# Patient Record
Sex: Female | Born: 1955 | Race: Black or African American | Hispanic: No | Marital: Married | Smoking: Former smoker
Health system: Southern US, Community
[De-identification: ages and names within clinical notes are randomized; demographics above are authoritative.]

## PROBLEM LIST (undated history)

## (undated) ENCOUNTER — Emergency Department (HOSPITAL_COMMUNITY): Disposition: A | Discharge status: Home / Self Care | Payer: BC Managed Care – PPO

## (undated) DIAGNOSIS — I10 Essential (primary) hypertension: Secondary | ICD-10-CM

## (undated) DIAGNOSIS — J439 Emphysema, unspecified: Secondary | ICD-10-CM

## (undated) HISTORY — PX: HAND SURGERY: SHX662

## (undated) HISTORY — PX: NOVASURE ABLATION: SHX5394

---

## 1997-11-30 ENCOUNTER — Ambulatory Visit (HOSPITAL_COMMUNITY): Admission: RE | Admit: 1997-11-30 | Discharge: 1997-11-30 | Payer: Self-pay | Admitting: Pulmonary Disease

## 1998-02-15 ENCOUNTER — Emergency Department (HOSPITAL_COMMUNITY): Admission: EM | Admit: 1998-02-15 | Discharge: 1998-02-15 | Payer: Self-pay | Admitting: Emergency Medicine

## 1998-06-10 ENCOUNTER — Other Ambulatory Visit: Admission: RE | Admit: 1998-06-10 | Discharge: 1998-06-10 | Payer: Self-pay | Admitting: Obstetrics and Gynecology

## 1998-08-19 ENCOUNTER — Ambulatory Visit: Admission: RE | Admit: 1998-08-19 | Discharge: 1998-08-19 | Payer: Self-pay | Admitting: Pulmonary Disease

## 1998-12-23 ENCOUNTER — Emergency Department (HOSPITAL_COMMUNITY): Admission: EM | Admit: 1998-12-23 | Discharge: 1998-12-23 | Payer: Self-pay | Admitting: Emergency Medicine

## 1999-09-02 ENCOUNTER — Other Ambulatory Visit: Admission: RE | Admit: 1999-09-02 | Discharge: 1999-09-02 | Payer: Self-pay | Admitting: Obstetrics and Gynecology

## 1999-10-27 ENCOUNTER — Ambulatory Visit: Admission: RE | Admit: 1999-10-27 | Discharge: 1999-10-27 | Payer: Self-pay | Admitting: Pulmonary Disease

## 2000-10-14 ENCOUNTER — Ambulatory Visit: Admission: RE | Admit: 2000-10-14 | Discharge: 2000-10-14 | Payer: Self-pay | Admitting: Pulmonary Disease

## 2001-07-12 ENCOUNTER — Emergency Department (HOSPITAL_COMMUNITY): Admission: EM | Admit: 2001-07-12 | Discharge: 2001-07-12 | Payer: Self-pay | Admitting: Emergency Medicine

## 2002-12-19 ENCOUNTER — Other Ambulatory Visit: Admission: RE | Admit: 2002-12-19 | Discharge: 2002-12-19 | Payer: Self-pay | Admitting: Obstetrics and Gynecology

## 2003-01-26 ENCOUNTER — Encounter: Admission: RE | Admit: 2003-01-26 | Discharge: 2003-01-26 | Payer: Self-pay | Admitting: Orthopedic Surgery

## 2003-01-26 ENCOUNTER — Encounter: Payer: Self-pay | Admitting: Orthopedic Surgery

## 2003-01-30 ENCOUNTER — Ambulatory Visit (HOSPITAL_BASED_OUTPATIENT_CLINIC_OR_DEPARTMENT_OTHER): Admission: RE | Admit: 2003-01-30 | Discharge: 2003-01-30 | Payer: Self-pay | Admitting: Orthopedic Surgery

## 2004-04-25 ENCOUNTER — Other Ambulatory Visit: Admission: RE | Admit: 2004-04-25 | Discharge: 2004-04-25 | Payer: Self-pay | Admitting: Obstetrics and Gynecology

## 2005-06-02 ENCOUNTER — Other Ambulatory Visit: Admission: RE | Admit: 2005-06-02 | Discharge: 2005-06-02 | Payer: Self-pay | Admitting: Obstetrics and Gynecology

## 2005-06-17 ENCOUNTER — Encounter: Admission: RE | Admit: 2005-06-17 | Discharge: 2005-06-17 | Payer: Self-pay | Admitting: Obstetrics and Gynecology

## 2007-01-13 ENCOUNTER — Encounter: Admission: RE | Admit: 2007-01-13 | Discharge: 2007-01-13 | Payer: Self-pay | Admitting: Obstetrics and Gynecology

## 2010-05-22 ENCOUNTER — Ambulatory Visit (HOSPITAL_COMMUNITY): Admission: RE | Admit: 2010-05-22 | Discharge: 2010-05-22 | Payer: Self-pay | Admitting: Obstetrics and Gynecology

## 2010-09-23 LAB — CBC
HCT: 39 % (ref 36.0–46.0)
Hemoglobin: 13.2 g/dL (ref 12.0–15.0)
MCH: 32.3 pg (ref 26.0–34.0)
MCHC: 33.8 g/dL (ref 30.0–36.0)
MCV: 95.7 fL (ref 78.0–100.0)
Platelets: 209 10*3/uL (ref 150–400)
RBC: 4.08 MIL/uL (ref 3.87–5.11)
RDW: 12.6 % (ref 11.5–15.5)
WBC: 8.9 10*3/uL (ref 4.0–10.5)

## 2010-09-23 LAB — PREGNANCY, URINE: Preg Test, Ur: NEGATIVE

## 2010-11-28 NOTE — Op Note (Signed)
NAME:  Teresa Woodward, Teresa Woodward                          ACCOUNT NO.:  [id number]   MEDICAL RECORD NO.:  [id number]                   PATIENT TYPE:  AMB   LOCATION:  DSC                                  FACILITY:  [facility]   PHYSICIAN:  Katy Fitch. Naaman Plummer., M.D.          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  Identification of ganglion cyst tracking along index metacarpal artery.   OPERATION PERFORMED:  Excision of ganglion from left index carpometacarpal  joint tracking along index metacarpal artery.   SURGEON:  Katy Fitch. Sypher, M.D.   ASSISTANT:  Jonni Sanger, P.A.   ANESTHESIA:  Infraclavicular block.   SUPERVISING ANESTHESIOLOGIST:  Dr. Noreene Larsson.   INDICATIONS FOR PROCEDURE:  Teresa Woodward is a 55 year old woman referred by  Dr. Pete Glatter for evaluation of an enlarging mass on the dorsal aspect of  her right hand.  Clinical examination revealed a painless mass that she  noted was enlarging.  This was consistent with a probable periarterial  ganglion tracking along the path of the index metacarpal artery.  X-rays of  her hand were nondiagnostic.  We recommended excisional biopsy for diagnosis  and hopeful resolution of this predicament.  Preoperatively, Teresa Woodward was  advised that ganglia are poorly understood and can recur, particularly when  noted to be tracking along the wall of an artery.  After informed consent  she is brought to the operating room at this time.   DESCRIPTION OF PROCEDURE:  Teresa Woodward was brought to the operating room  and placed in supine position on the operating table.   Following an  infraclavicular block in the holding area by Dr. Noreene Larsson, anesthesia was  satisfactory in the right arm.  The arm was prepped with Betadine soap and  solution and sterilely draped.   The arm was exsanguinated with an Esmarch bandage and an arterial tourniquet  on the proximal brachium inflated to 220 mmHg.  The procedure commenced with  a transverse incision directly over the mass.  Subcutaneous tissues were  carefully divided revealing a mass presenting between the extensor carpi  radialis longus and brevis tendons.  This was circumferentially dissected  and found to be a significant ganglion measuring about 2 cm in length and  about 1.5 cm in width tracking along the index metacarpal artery.  She had a  large caliber artery measuring about 2 mm in diameter.  The mass was  circumferentially dissected off the artery and followed back to the [facility]  joint.  There it was amputated with a rongeur.  The exit point from the [facility]  joint  was electrocauterized thoroughly with bipolar forceps taking care to  protect the index metacarpal artery.  The wound was irrigated.  Tourniquet  was released.  No hemostasis issues were noted.  The wound was then repaired  with intradermal 3-0 Prolene and then Steri-Strips.  Compressive dressing  was applied with a volar plaster splint maintaining the wrist in 5 degrees  dorsiflexion.   For aftercare, Teresa Woodward is given a prescription for Percocet 5 mg one to  two tablets by mouth every four to six hours as needed for pain.  Total of  20 tablets without refill.  She will return to our office in follow-up in  seven to 10 days for dressing change and advancement to an exercise program.                                                Katy Fitch. Naaman Plummer., M.D.    RVS/MEDQ  D:  01/30/2003  T:  01/30/2003  Job:  [id number]   cc:   Hal T. Stoneking, M.D.  [address] [place], [place] [address]  Fax: [phone]

## 2011-07-21 ENCOUNTER — Emergency Department (HOSPITAL_COMMUNITY): Payer: BC Managed Care – PPO

## 2011-07-21 ENCOUNTER — Encounter: Payer: Self-pay | Admitting: Emergency Medicine

## 2011-07-21 ENCOUNTER — Observation Stay (HOSPITAL_COMMUNITY)
Admission: EM | Admit: 2011-07-21 | Discharge: 2011-07-22 | Disposition: A | Discharge status: Home / Self Care | Payer: BC Managed Care – PPO | Attending: Emergency Medicine | Admitting: Emergency Medicine

## 2011-07-21 DIAGNOSIS — R0602 Shortness of breath: Secondary | ICD-10-CM | POA: Insufficient documentation

## 2011-07-21 DIAGNOSIS — R079 Chest pain, unspecified: Principal | ICD-10-CM | POA: Insufficient documentation

## 2011-07-21 DIAGNOSIS — J439 Emphysema, unspecified: Secondary | ICD-10-CM | POA: Insufficient documentation

## 2011-07-21 HISTORY — DX: Emphysema, unspecified: J43.9

## 2011-07-21 LAB — COMPREHENSIVE METABOLIC PANEL
ALT: 11 U/L (ref 0–35)
AST: 18 U/L (ref 0–37)
Albumin: 3.7 g/dL (ref 3.5–5.2)
Alkaline Phosphatase: 78 U/L (ref 39–117)
BUN: 8 mg/dL (ref 6–23)
CO2: 23 mEq/L (ref 19–32)
Calcium: 9.4 mg/dL (ref 8.4–10.5)
Chloride: 103 mEq/L (ref 96–112)
Creatinine, Ser: 0.64 mg/dL (ref 0.50–1.10)
GFR calc Af Amer: >90 mL/min (ref >90)
GFR calc non Af Amer: >90 mL/min (ref >90)
Glucose, Bld: 106 mg/dL — ABNORMAL HIGH (ref 70–99)
Potassium: 3.5 mEq/L (ref 3.5–5.1)
Sodium: 138 mEq/L (ref 135–145)
Total Bilirubin: 0.3 mg/dL (ref 0.3–1.2)
Total Protein: 7.8 g/dL (ref 6.0–8.3)

## 2011-07-21 LAB — CARDIAC PANEL(CRET KIN+CKTOT+MB+TROPI)
CK, MB: 1.6 ng/mL (ref 0.3–4.0)
Relative Index: INVALID (ref 0.0–2.5)
Total CK: 48 U/L (ref 7–177)
Troponin I: <0.3 ng/mL (ref <0.30)

## 2011-07-21 LAB — CBC
HCT: 37.4 % (ref 36.0–46.0)
Hemoglobin: 13.4 g/dL (ref 12.0–15.0)
MCH: 32.2 pg (ref 26.0–34.0)
MCHC: 35.8 g/dL (ref 30.0–36.0)
MCV: 89.9 fL (ref 78.0–100.0)
Platelets: 224 10*3/uL (ref 150–400)
RBC: 4.16 MIL/uL (ref 3.87–5.11)
RDW: 12.1 % (ref 11.5–15.5)
WBC: 6.9 10*3/uL (ref 4.0–10.5)

## 2011-07-21 LAB — POCT I-STAT TROPONIN I
Troponin i, poc: 0.01 ng/mL (ref 0.00–0.08)
Troponin i, poc: 0.01 ng/mL (ref 0.00–0.08)

## 2011-07-21 LAB — LIPASE, BLOOD: Lipase: 14 U/L (ref 11–59)

## 2011-07-21 MED ORDER — IOHEXOL 300 MG/ML  SOLN
80.0000 mL | Freq: Once | INTRAMUSCULAR | Status: AC | PRN
  Administered 2011-07-21: 80 mL via INTRAVENOUS

## 2011-07-21 NOTE — ED Notes (Signed)
Move to CDU--chest pain protocol-done

## 2011-07-21 NOTE — ED Notes (Signed)
Received partial report from Vela Prose, RN.  Advised pt may come to CDU 7 after triage/hx info obtained and entered.

## 2011-07-21 NOTE — ED Notes (Signed)
Pt's bp went up when I was getting her bp, because she was going to CT and she was nervious.1:00pm JG

## 2011-07-21 NOTE — ED Provider Notes (Addendum)
History     CSN: [id number]  Arrival date & time 07/21/11  1141   First MD Initiated Contact with Patient 07/21/11 1147      No chief complaint on file. CC: chest pain  (Consider location/radiation/quality/duration/timing/severity/associated sxs/prior treatment) The history is provided by the patient.  HPI: Pt p/w chest pain starting this morning at 0800. Pain is substernal and worse with inspiration. Does not radiate. No nausea vomiting. Has never had cardiac workup. Mild SOB with no wheezing, cough. No recent travel. Pt does admit to quite a bit of stress recently.       History  Substance Use Topics  . Smoking status: Not on file  . Smokeless tobacco: Not on file  . Alcohol Use: Not on file    OB History    No data available      Review of Systems  Constitutional: Negative for fever and chills.  HENT: Negative.   Eyes: Negative.   Respiratory: Positive for chest tightness and shortness of breath. Negative for apnea, cough and wheezing.   Cardiovascular: Positive for chest pain. Negative for palpitations and leg swelling.  Gastrointestinal: Negative.   Genitourinary: Negative.   Musculoskeletal: Negative.   Skin: Negative.   Neurological: Negative.   Psychiatric/Behavioral:       +stress/anxiety    Allergies  Sulfa antibiotics  Home Medications   Current Outpatient Rx  Name Route Sig Dispense Refill  . ZOLPIDEM TARTRATE 10 MG PO TABS Oral Take 5 mg by mouth at bedtime as needed. For sleep       BP 134/91  Pulse 68  Temp(Src) 98.2 F (36.8 C) (Oral)  Resp 13  SpO2 100%  Physical Exam  Nursing note and vitals reviewed. Constitutional: She is oriented to person, place, and time. She appears well-developed and well-nourished. No distress.  HENT:  Head: Normocephalic and atraumatic.  Mouth/Throat: Oropharynx is clear and moist.  Eyes: EOM are normal. Pupils are equal, round, and reactive to light.  Neck: Normal range of motion. Neck supple.    Cardiovascular: Normal rate, regular rhythm, normal heart sounds and intact distal pulses.  Exam reveals no gallop and no friction rub.   No murmur heard. Pulmonary/Chest: Effort normal and breath sounds normal. No respiratory distress. She has no wheezes. She has no rales. She exhibits no tenderness.  Abdominal: Soft. Bowel sounds are normal.  Musculoskeletal: Normal range of motion. She exhibits no edema and no tenderness.  Neurological: She is alert and oriented to person, place, and time.  Skin: Skin is warm and dry. No rash noted. No erythema.  Psychiatric: She has a normal mood and affect. Her behavior is normal.    ED Course  Procedures (including critical care time)  Labs Reviewed  COMPREHENSIVE METABOLIC PANEL - Abnormal; Notable for the following:    Glucose, Bld 106 (*)    All other components within normal limits  CARDIAC PANEL(CRET KIN+CKTOT+MB+TROPI)  CBC  LIPASE, BLOOD   Dg Chest 2 View  07/21/2011  *RADIOLOGY REPORT*  Clinical Data: Chest pain, shortness of breath  CHEST - 2 VIEW  Comparison: None.  Findings: Mild right apical opacity, at least some of which may be osseous.  Lungs otherwise clear. No pleural effusion or pneumothorax.  Cardiomediastinal silhouette is within normal limits.  Mild degenerative changes of the visualized thoracolumbar spine.  IMPRESSION: Mild right apical opacity.  CT chest is suggested for further evaluation to exclude underlying mass.  Original Report Authenticated By: Charline Bills, M.D.  No diagnosis found.   Date: 07/21/2011  Rate:77  Rhythm: normal sinus rhythm  QRS Axis: normal  Intervals: normal  ST/T Wave abnormalities: normal  Conduction Disutrbances:none  Narrative Interpretation:   Old EKG Reviewed: none available   MDM  Pt received aspin and NTG en route and chest pain is now completely resolved         Loren Racer, MD 07/24/11 2202

## 2011-07-21 NOTE — ED Notes (Signed)
Heart healthy diet ordered. 

## 2011-07-21 NOTE — ED Notes (Signed)
Pt returned from CT angio

## 2011-07-21 NOTE — ED Notes (Signed)
Per EMS pt took her mother to the MD and pt was having chest pain. EMS was called. + SOB. Denied n/v/diaphoresis

## 2011-07-22 DIAGNOSIS — R072 Precordial pain: Secondary | ICD-10-CM

## 2011-07-22 LAB — POCT I-STAT TROPONIN I: Troponin i, poc: 0 ng/mL (ref 0.00–0.08)

## 2011-07-22 MED ORDER — ONDANSETRON HCL 4 MG/2ML IJ SOLN
INTRAMUSCULAR | Status: AC
  Filled 2011-07-22: qty 2

## 2011-07-22 MED ORDER — ONDANSETRON HCL 4 MG/2ML IJ SOLN
4.0000 mg | Freq: Once | INTRAMUSCULAR | Status: AC
  Administered 2011-07-22: 4 mg via INTRAVENOUS
  Filled 2011-07-22: qty 2

## 2011-07-22 NOTE — ED Notes (Signed)
Patient refused zofran. States she did not want to be stuck or given anymore meds at this time. Will reevaluate and continue to monitor patient

## 2011-07-22 NOTE — ED Notes (Signed)
Report received, assumed care.  

## 2011-07-22 NOTE — Progress Notes (Signed)
*  PRELIMINARY RESULTS* Echocardiogram Echocardiogram Stress Test has been performed.  Clide Deutscher RDCS 07/22/2011, 11:53 AM

## 2011-07-22 NOTE — ED Notes (Addendum)
Informed patient and/or family of status. No voiced complaints presently. NAD. Denies any nausea presently.

## 2011-07-22 NOTE — ED Notes (Signed)
Returned from stress echo. Tolerated well. Denies any CP, SOB.

## 2011-07-22 NOTE — ED Notes (Signed)
Transported for stress echo via w/c

## 2011-07-22 NOTE — ED Notes (Signed)
Patient in room vomiting in trash can. EDP notified.

## 2011-07-22 NOTE — ED Provider Notes (Signed)
Patient placed in CDU under chest pain protocol.  Patient with recent onset of right sided substernal chest pain, pleuritic in nature.  CTA performed earlier negative for PE.  Patient has strong family cardiac history.  Patient scheduled for stress echo in AM.  Patient resting comfortably with family at bedside.  Lungs CTA bilaterally.  S1/S2, RRR, no murmur. Abdomen soft, bowel sounds present.  Strong distal pulses palpated all extremities.  Cardiac markers negative. 12 lead reviewed, no indication of ischemia.  Treatment and diagnostic plan discussed with patient and family.  12:04 AM  Patient signed out to Dr. Patria Mane.  Jimmye Norman, NP 07/22/11 0004

## 2011-07-22 NOTE — Progress Notes (Signed)
Observation review is complete. 

## 2011-07-22 NOTE — ED Provider Notes (Signed)
History     CSN: [id number]  Arrival date & time 07/21/11  1141   First MD Initiated Contact with Patient 07/21/11 1147      Chief Complaint  Patient presents with  . Chest Pain    (Consider location/radiation/quality/duration/timing/severity/associated sxs/prior treatment) HPI  Past Medical History  Diagnosis Date  . Bullous emphysema     Past Surgical History  Procedure Date  . Hand surgery   . Novasure ablation     No family history on file.  History  Substance Use Topics  . Smoking status: Former Smoker -- 0.2 packs/day for 5 years  . Smokeless tobacco: Not on file  . Alcohol Use: 3.5 oz/week    7 drink(s) per week    OB History    Grav Para Term Preterm Abortions TAB SAB Ect Mult Living                  Review of Systems  Allergies  Sulfa antibiotics  Home Medications   Current Outpatient Rx  Name Route Sig Dispense Refill  . ZOLPIDEM TARTRATE 10 MG PO TABS Oral Take 5 mg by mouth at bedtime as needed. For sleep       BP 128/80  Pulse 68  Temp(Src) 98.2 F (36.8 C) (Oral)  Resp 19  SpO2 93%  Physical Exam  ED Course  Procedures (including critical care time)  Labs Reviewed  COMPREHENSIVE METABOLIC PANEL - Abnormal; Notable for the following:    Glucose, Bld 106 (*)    All other components within normal limits  CARDIAC PANEL(CRET KIN+CKTOT+MB+TROPI)  CBC  LIPASE, BLOOD  POCT I-STAT TROPONIN I  POCT I-STAT TROPONIN I  POCT I-STAT TROPONIN I  I-STAT TROPONIN I  I-STAT TROPONIN I  I-STAT TROPONIN I   Dg Chest 2 View  07/21/2011  *RADIOLOGY REPORT*  Clinical Data: Chest pain, shortness of breath  CHEST - 2 VIEW  Comparison: None.  Findings: Mild right apical opacity, at least some of which may be osseous.  Lungs otherwise clear. No pleural effusion or pneumothorax.  Cardiomediastinal silhouette is within normal limits.  Mild degenerative changes of the visualized thoracolumbar spine.  IMPRESSION: Mild right apical opacity.  CT chest is  suggested for further evaluation to exclude underlying mass.  Original Report Authenticated By: Charline Bills, M.D.   Ct Angio Chest W/cm &/or Wo Cm  07/21/2011  *RADIOLOGY REPORT*  Clinical Data: Mass lesion.  Chest pain.  CT ANGIOGRAPHY CHEST  Technique:  Multidetector CT imaging of the chest using the standard protocol during bolus administration of intravenous contrast. Multiplanar reconstructed images including MIPs were obtained and reviewed to evaluate the vascular anatomy.  Comparison: Two-view chest 07/21/2011.  Findings: Pulmonary arterial opacification is satisfactory.  No focal filling defects are evident to suggest pulmonary emboli.  The heart size is normal.  No significant adenopathy is present. There is no significant pleural or pericardial effusion.  Limited imaging of the upper abdomen is unremarkable.  A moderate emphysematous changes are noted at the lung apices bilaterally.  There is a pleural-based soft tissue mass posteriorly in the right upper lobe associated the pleura.  The soft tissue measures 2.9 x 0.9 x 2.1 cm.  There is slight pleural thickening or scarring at the left lung apex.  The bone windows are unremarkable.  IMPRESSION:  1.  2.9 cm soft tissue nodule versus scarring at the right lung apex.  This is pleural-based.  PET scan would be useful for further evaluation as clinically indicated.  Alternately, short term 3- month follow-up chest CT scan without contrast could be utilized. 2.  No evidence for pulmonary embolus. 3.  Emphysema.  Original Report Authenticated By: Jamesetta Orleans. MATTERN, M.D.   1:00 PM Lab workup and stress test were negative. Case discussed with Dr. Pete Glatter, her internist.  Where her workup is negative, it is safe to go home.  She should call his office to make a followup appointment with him.   1. Chest pain             Carleene Cooper III, MD 07/22/11 1304

## 2011-07-22 NOTE — ED Provider Notes (Signed)
11:55 AM I received a call from cardiology stating patient did have hypertensive response to stress test but otherwise had a normal stress echo.    I have discussed result with Dr Ignacia Palma who is taking care of patient.    Dillard Cannon [place], [place] 07/22/11 1218

## 2011-07-24 NOTE — ED Provider Notes (Signed)
Medical screening examination/treatment/procedure(s) were performed by non-physician practitioner and as supervising physician I was immediately available for consultation/collaboration.   Bridgit Eynon, MD 07/24/11 0156 

## 2011-07-25 NOTE — ED Provider Notes (Signed)
Medical screening examination/treatment/procedure(s) were performed by non-physician practitioner and as supervising physician I was immediately available for consultation/collaboration.  Loren Racer, MD 07/25/11 [phone]

## 2011-07-27 ENCOUNTER — Other Ambulatory Visit: Payer: Self-pay | Admitting: Obstetrics and Gynecology

## 2011-10-29 ENCOUNTER — Other Ambulatory Visit: Payer: Self-pay | Admitting: Geriatric Medicine

## 2011-10-29 DIAGNOSIS — D386 Neoplasm of uncertain behavior of respiratory organ, unspecified: Secondary | ICD-10-CM

## 2011-11-02 ENCOUNTER — Ambulatory Visit
Admission: RE | Admit: 2011-11-02 | Discharge: 2011-11-02 | Disposition: A | Discharge status: Home / Self Care | Payer: BC Managed Care – PPO | Source: Ambulatory Visit | Attending: Geriatric Medicine | Admitting: Geriatric Medicine

## 2011-11-02 DIAGNOSIS — D386 Neoplasm of uncertain behavior of respiratory organ, unspecified: Secondary | ICD-10-CM

## 2012-08-23 ENCOUNTER — Other Ambulatory Visit: Payer: Self-pay | Admitting: Obstetrics and Gynecology

## 2012-08-23 DIAGNOSIS — R928 Other abnormal and inconclusive findings on diagnostic imaging of breast: Secondary | ICD-10-CM

## 2012-08-24 ENCOUNTER — Telehealth: Payer: Self-pay | Admitting: Genetic Counselor

## 2012-08-24 NOTE — Telephone Encounter (Signed)
S/W PT IN REF TO NP APPT WITH [facility] REFERRING DR Dareen Piano

## 2012-09-14 ENCOUNTER — Ambulatory Visit
Admission: RE | Admit: 2012-09-14 | Discharge: 2012-09-14 | Disposition: A | Discharge status: Home / Self Care | Payer: No Typology Code available for payment source | Source: Ambulatory Visit | Attending: Obstetrics and Gynecology | Admitting: Obstetrics and Gynecology

## 2012-09-14 ENCOUNTER — Other Ambulatory Visit: Payer: Self-pay | Admitting: Obstetrics and Gynecology

## 2012-09-14 DIAGNOSIS — R928 Other abnormal and inconclusive findings on diagnostic imaging of breast: Secondary | ICD-10-CM

## 2012-10-31 ENCOUNTER — Other Ambulatory Visit: Payer: BC Managed Care – PPO

## 2012-10-31 ENCOUNTER — Encounter: Payer: Self-pay | Admitting: Genetic Counselor

## 2012-10-31 ENCOUNTER — Ambulatory Visit (HOSPITAL_BASED_OUTPATIENT_CLINIC_OR_DEPARTMENT_OTHER): Payer: No Typology Code available for payment source | Admitting: Genetic Counselor

## 2012-10-31 DIAGNOSIS — Z800 Family history of malignant neoplasm of digestive organs: Secondary | ICD-10-CM

## 2012-10-31 DIAGNOSIS — Z803 Family history of malignant neoplasm of breast: Secondary | ICD-10-CM

## 2012-10-31 DIAGNOSIS — Z8249 Family history of ischemic heart disease and other diseases of the circulatory system: Secondary | ICD-10-CM

## 2012-10-31 DIAGNOSIS — IMO0002 Reserved for concepts with insufficient information to code with codable children: Secondary | ICD-10-CM

## 2012-10-31 NOTE — Progress Notes (Signed)
Dr.  Malva Limes requested a consultation for genetic counseling and risk assessment for Teresa Woodward, a 57 y.o. female, for discussion of her family history of breast, prostate, colon and brain cancer. She presents to clinic today to discuss the possibility of a genetic predisposition to cancer, and to further clarify her risks, as well as her family members' risks for cancer.   HISTORY OF PRESENT ILLNESS: Teresa Woodward is a 56 y.o. female with no personal history of cancer.    Past Medical History  Diagnosis Date  . Bullous emphysema     Past Surgical History  Procedure Laterality Date  . Hand surgery    . Novasure ablation      History  Substance Use Topics  . Smoking status: Former Smoker -- 0.25 packs/day for 5 years  . Smokeless tobacco: Former Neurosurgeon    Quit date: 11/01/1995  . Alcohol Use: 3.5 oz/week    7 drink(s) per week    REPRODUCTIVE HISTORY AND PERSONAL RISK ASSESSMENT FACTORS: Menarche was at age 22.   Menopause at age 40 Uterus Intact: Yes Ovaries Intact: Yes G3P3A0 , first live birth at age 69  She has not previously undergone treatment for infertility.   OCP use for 1 year   She has not used HRT in the past.    FAMILY HISTORY:  We obtained a detailed, 4-generation family history.  Significant diagnoses are listed below: Family History  Problem Relation Age of Onset  . Breast cancer Mother 63  . Colon cancer Father 55    smoker  . Breast cancer Sister     dx in her 26s; paternal half sister  . Prostate cancer Brother     dx in his 67s; full sibling  . Brain cancer Maternal Aunt     dx in her 82s  . Breast cancer Sister     dx in her 54s; paternal half sister  . Prostate cancer Brother     dx in his 5s; full sibling  . Brain cancer Maternal Aunt     dx in her 79s  . Prostate cancer Cousin     maternal cousin  The patient has three full brothers and 10 paternal half siblings.  Two full brothers developed prostate cancer in their 34s.   Two half sisters developed breast cancer in their 69s.  The patient's father was a smoker and developed colon cancer at 6, and her mother was diagnosed with breast cancer at age 32.  Her mother had 71 brothers and sisters.  Two sisters had brain tumors, and a maternal cousin had prostate cancer.  The patient's father had one sister who did not have cancer.  His father died in his 78s-30s from a farm accident and his mother died in her 20s-60s resulting from an accident.  No other cancer was reported in the family.  Patient's maternal ancestors are of [place] and [place] descent, and paternal ancestors are of [place] and [place] descent. There is no reported Ashkenazi Jewish ancestry. There is no known consanguinity.  GENETIC COUNSELING RISK ASSESSMENT, DISCUSSION, AND SUGGESTED FOLLOW UP: We reviewed the natural history and genetic etiology of sporadic, familial and hereditary cancer syndromes.  About 5-10% of breast cancer is hereditary.  Of this, about 85% is the result of a BRCA1 or BRCA2 mutation.  We reviewed the red flags of hereditary cancer syndromes and the dominant inheritance patterns. At this time, she does not meet the medical criteria for genetic testing  based on her family history as she would need a total of 3 family members with breast cancer from one side of her family.  She has two sisters from her father's side, and only her mother on the maternal side.  The patient's family history is suggestive of the following possible diagnosis: sporadic or familial breast cancer  In order to estimate her chance of having a BRCA mutation, we used statistical models (Tyrer Cusik and [employer]) and laboratory data that take into account her personal medical history, family history and ancestry.  Because each model is different, there can be a lot of variability in the risks they give.  Therefore, these numbers must be considered a rough range and not a precise risk of having a BRCA  mutation.  These models estimate that she has approximately a 0.15-3% chance of having a mutation. Based on this assessment of her family and personal history, genetic testing is not recommended.  Based on the patient's personal and family history, statistical models (Tyrer cusik)  and literature data were used to estimate her risk of developing breast cancer. These estimate her lifetime risk of developing breast cancer to be approximately 12.2-17.6%. This estimation does not take into account any genetic testing results.   After considering the risks, benefits, and limitations, the patient declined testing.   The patient was seen for a total of 60 minutes, greater than 50% of which was spent face-to-face counseling.  This plan is being carried out per Dr. Loraine Leriche Anderson's recommendations.  This note will also be sent to the referring provider via the electronic medical record. The patient will be supplied with a summary of this genetic counseling discussion as well as educational information on the discussed hereditary cancer syndromes following the conclusion of their visit.   Patient was discussed with Dr. Drue Second.   EPIC CC: Malva Limes, MD Merlene Laughter, MD   _______________________________________________________________________ For Office Staff:  Number of people involved in session: 3 Was an Intern/ student involved with case: yes }

## 2012-11-01 ENCOUNTER — Other Ambulatory Visit: Payer: Self-pay | Admitting: Geriatric Medicine

## 2012-11-01 DIAGNOSIS — R9389 Abnormal findings on diagnostic imaging of other specified body structures: Secondary | ICD-10-CM

## 2012-11-04 ENCOUNTER — Ambulatory Visit
Admission: RE | Admit: 2012-11-04 | Discharge: 2012-11-04 | Disposition: A | Discharge status: Home / Self Care | Payer: No Typology Code available for payment source | Source: Ambulatory Visit | Attending: Geriatric Medicine | Admitting: Geriatric Medicine

## 2012-11-04 DIAGNOSIS — R9389 Abnormal findings on diagnostic imaging of other specified body structures: Secondary | ICD-10-CM

## 2012-11-04 MED ORDER — IOHEXOL 240 MG/ML SOLN: 75.0000 mL | Freq: Once | INTRAMUSCULAR | Status: AC | PRN

## 2013-08-22 ENCOUNTER — Other Ambulatory Visit: Payer: Self-pay | Admitting: Obstetrics and Gynecology

## 2013-09-08 ENCOUNTER — Emergency Department (HOSPITAL_COMMUNITY)
Admission: EM | Admit: 2013-09-08 | Discharge: 2013-09-09 | Disposition: A | Discharge status: Home / Self Care | Payer: BC Managed Care – PPO | Attending: Emergency Medicine | Admitting: Emergency Medicine

## 2013-09-08 ENCOUNTER — Emergency Department (HOSPITAL_COMMUNITY): Payer: BC Managed Care – PPO

## 2013-09-08 ENCOUNTER — Encounter (HOSPITAL_COMMUNITY): Payer: Self-pay | Admitting: Emergency Medicine

## 2013-09-08 DIAGNOSIS — Y92009 Unspecified place in unspecified non-institutional (private) residence as the place of occurrence of the external cause: Secondary | ICD-10-CM | POA: Insufficient documentation

## 2013-09-08 DIAGNOSIS — Z8709 Personal history of other diseases of the respiratory system: Secondary | ICD-10-CM | POA: Insufficient documentation

## 2013-09-08 DIAGNOSIS — I10 Essential (primary) hypertension: Secondary | ICD-10-CM | POA: Insufficient documentation

## 2013-09-08 DIAGNOSIS — Y939 Activity, unspecified: Secondary | ICD-10-CM | POA: Insufficient documentation

## 2013-09-08 DIAGNOSIS — W1809XA Striking against other object with subsequent fall, initial encounter: Secondary | ICD-10-CM | POA: Insufficient documentation

## 2013-09-08 DIAGNOSIS — S20219A Contusion of unspecified front wall of thorax, initial encounter: Secondary | ICD-10-CM | POA: Insufficient documentation

## 2013-09-08 DIAGNOSIS — Z79899 Other long term (current) drug therapy: Secondary | ICD-10-CM | POA: Insufficient documentation

## 2013-09-08 DIAGNOSIS — S20212A Contusion of left front wall of thorax, initial encounter: Secondary | ICD-10-CM

## 2013-09-08 DIAGNOSIS — W19XXXA Unspecified fall, initial encounter: Secondary | ICD-10-CM

## 2013-09-08 DIAGNOSIS — Z87891 Personal history of nicotine dependence: Secondary | ICD-10-CM | POA: Insufficient documentation

## 2013-09-08 HISTORY — DX: Essential (primary) hypertension: I10

## 2013-09-08 NOTE — ED Notes (Signed)
Pt ambulatory to exam room with steady gait.  

## 2013-09-08 NOTE — ED Notes (Addendum)
Pt reports that at 2045 tonight she fell while going down a ramp due to slipping on ice. Pt reports hitting her head, however denies LOC, pt denies anticoagulant therapy. Pt is A/O x4, in NAD, and vitals are WDL.

## 2013-09-09 NOTE — ED Provider Notes (Signed)
Medical screening examination/treatment/procedure(s) were performed by non-physician practitioner and as supervising physician I was immediately available for consultation/collaboration.   EKG Interpretation None        Julianne Rice, MD 09/09/13 [phone]

## 2013-09-09 NOTE — ED Provider Notes (Signed)
CSN: [id number]     Arrival date & time 09/08/13  2125 History   First MD Initiated Contact with Patient 09/08/13 2309     Chief Complaint  Patient presents with  . Fall   HPI  History provided by the patient. Patient is a 58 year old female with history of hypertension who presents with pain soreness after a fall. Patient reports slipping on her ramp at home outside causing her to land on her bottom and left side. She also reports bumping her head slightly. There was no LOC. Since that time she has pain in her left lateral rib area. She denies any shortness of breath. No headache, weakness or numbness. She denies any medications for treatment. No other aggravating or alleviating factors. No other associated symptoms.    Past Medical History  Diagnosis Date  . Bullous emphysema   . Hypertension    Past Surgical History  Procedure Laterality Date  . Hand surgery    . Novasure ablation     Family History  Problem Relation Age of Onset  . Breast cancer Mother 72  . Colon cancer Father 61    smoker  . Breast cancer Sister     dx in her 15s; paternal half sister  . Prostate cancer Brother     dx in his 66s; full sibling  . Brain cancer Maternal Aunt     dx in her 66s  . Breast cancer Sister     dx in her 17s; paternal half sister  . Prostate cancer Brother     dx in his 75s; full sibling  . Brain cancer Maternal Aunt     dx in her 51s  . Prostate cancer Cousin     maternal cousin   History  Substance Use Topics  . Smoking status: Former Smoker -- 0.25 packs/day for 5 years  . Smokeless tobacco: Former Systems developer    Quit date: 11/01/1995  . Alcohol Use: 3.5 oz/week    7 drink(s) per week   OB History   Grav Para Term Preterm Abortions TAB SAB Ect Mult Living                 Review of Systems  Eyes: Negative for visual disturbance.  Neurological: Negative for syncope, weakness, numbness and headaches.  All other systems reviewed and are negative.      Allergies    Sulfa antibiotics  Home Medications   Current Outpatient Rx  Name  Route  Sig  Dispense  Refill  . acetaminophen-codeine (TYLENOL #3) 300-30 MG per tablet   Oral   Take 1-2 tablets by mouth every 6 (six) hours as needed for moderate pain.         . hydrochlorothiazide (MICROZIDE) 12.5 MG capsule   Oral   Take 12.5 mg by mouth daily.         Marland Kitchen zolpidem (AMBIEN) 10 MG tablet   Oral   Take 5 mg by mouth at bedtime as needed. For sleep           BP 159/98  Pulse 81  Temp(Src) 97.8 F (36.6 C) (Oral)  Resp 18  SpO2 98% Physical Exam  Nursing note and vitals reviewed. Constitutional: She is oriented to person, place, and time. She appears well-developed and well-nourished. No distress.  HENT:  Head: Normocephalic and atraumatic.  No Battle sign or raccoon eyes  Eyes: Conjunctivae and EOM are normal. Pupils are equal, round, and reactive to light.  Neck: Normal range of motion. Neck  supple.  No cervical midline tenderness.  NEXUS criteria met.  Cardiovascular: Normal rate and regular rhythm.   Pulmonary/Chest: Effort normal and breath sounds normal. No respiratory distress. She has no wheezes. She has no rales. She exhibits tenderness.    Abdominal: Soft. There is no tenderness. There is no rebound and no guarding.  Musculoskeletal: Normal range of motion. She exhibits no edema and no tenderness.  Neurological: She is alert and oriented to person, place, and time. She has normal strength. No cranial nerve deficit or sensory deficit. Gait normal.  Skin: Skin is warm and dry. No rash noted.  Psychiatric: She has a normal mood and affect. Her behavior is normal.    ED Course  Procedures   DIAGNOSTIC STUDIES: Oxygen Saturation is 98% on room air.    COORDINATION OF CARE:  Nursing notes reviewed. Vital signs reviewed. Initial pt interview and examination performed.   12:15 AM-patient seen and evaluated. She appears well no acute distress. Normal nonfocal neuro exam.  No concerning signs of head injury.     X-rays reviewed. No broken ribs. No other concerning findings on exam. Discussed x-rays with patient. She is very to return home. She is not requesting any medicine for pain at this time. She does have some leftover Tylenol 3's which she usually uses at home. Patient also instructed to use ibuprofen.    Imaging Review Dg Ribs Unilateral W/chest Left  09/09/2013   CLINICAL DATA:  Status post fall. Left posterior upper rib pain and shortness of breath.  EXAM: LEFT RIBS AND CHEST - 3+ VIEW  COMPARISON:  Chest radiograph performed 07/21/2011, and CT of the chest performed 11/04/2012  FINDINGS: No displaced rib fractures are seen.  The lungs are well-aerated. Mild biapical scarring is better characterized on prior CT. There is no evidence of focal opacification, pleural effusion or pneumothorax.  The cardiomediastinal silhouette is within normal limits. No acute osseous abnormalities are seen.  IMPRESSION: No displaced rib fractures seen. No acute cardiopulmonary process identified. Biapical scarring again noted.   Electronically Signed   By: Garald Balding M.D.   On: 09/09/2013 00:07    MDM   Final diagnoses:  Fall  Contusion of rib on left side        Martie Lee, PA-C 09/09/13 0028

## 2013-09-09 NOTE — Discharge Instructions (Signed)
Your x-rays did not show any broken bones. Please use Tylenol or an appropriate home to help with pain. Followup with your primary care provider for continued evaluation and treatment.    Contusion A contusion is a deep bruise. Contusions happen when an injury causes bleeding under the skin. Signs of bruising include pain, puffiness (swelling), and discolored skin. The contusion may turn blue, purple, or yellow. HOME CARE   Put ice on the injured area.  Put ice in a plastic bag.  Place a towel between your skin and the bag.  Leave the ice on for 15-20 minutes, 03-04 times a day.  Only take medicine as told by your doctor.  Rest the injured area.  If possible, raise (elevate) the injured area to lessen puffiness. GET HELP RIGHT AWAY IF:   You have more bruising or puffiness.  You have pain that is getting worse.  Your puffiness or pain is not helped by medicine. MAKE SURE YOU:   Understand these instructions.  Will watch your condition.  Will get help right away if you are not doing well or get worse. Document Released: 12/16/2007 Document Revised: 09/21/2011 Document Reviewed: 05/04/2011 [facility] Patient Information 2014 [place], [place].

## 2014-08-28 ENCOUNTER — Other Ambulatory Visit: Payer: Self-pay | Admitting: Obstetrics and Gynecology

## 2014-08-29 LAB — CYTOLOGY - PAP

## 2015-09-03 ENCOUNTER — Other Ambulatory Visit: Payer: Self-pay | Admitting: Obstetrics and Gynecology

## 2015-09-05 LAB — CYTOLOGY - PAP

## 2015-11-14 DIAGNOSIS — M79602 Pain in left arm: Secondary | ICD-10-CM | POA: Diagnosis not present

## 2015-11-14 DIAGNOSIS — M7542 Impingement syndrome of left shoulder: Secondary | ICD-10-CM | POA: Diagnosis not present

## 2015-11-26 DIAGNOSIS — M25512 Pain in left shoulder: Secondary | ICD-10-CM | POA: Diagnosis not present

## 2015-11-28 DIAGNOSIS — M25512 Pain in left shoulder: Secondary | ICD-10-CM | POA: Diagnosis not present

## 2015-11-28 DIAGNOSIS — Z5189 Encounter for other specified aftercare: Secondary | ICD-10-CM | POA: Diagnosis not present

## 2015-12-10 DIAGNOSIS — M25512 Pain in left shoulder: Secondary | ICD-10-CM | POA: Diagnosis not present

## 2015-12-12 DIAGNOSIS — M79622 Pain in left upper arm: Secondary | ICD-10-CM | POA: Diagnosis not present

## 2015-12-17 DIAGNOSIS — Z5189 Encounter for other specified aftercare: Secondary | ICD-10-CM | POA: Diagnosis not present

## 2015-12-17 DIAGNOSIS — M79602 Pain in left arm: Secondary | ICD-10-CM | POA: Diagnosis not present

## 2015-12-17 DIAGNOSIS — R21 Rash and other nonspecific skin eruption: Secondary | ICD-10-CM | POA: Diagnosis not present

## 2016-02-14 ENCOUNTER — Other Ambulatory Visit: Payer: Self-pay | Admitting: Nurse Practitioner

## 2016-02-14 ENCOUNTER — Ambulatory Visit
Admission: RE | Admit: 2016-02-14 | Discharge: 2016-02-14 | Disposition: A | Discharge status: Home / Self Care | Payer: BLUE CROSS/BLUE SHIELD | Source: Ambulatory Visit | Attending: Nurse Practitioner | Admitting: Nurse Practitioner

## 2016-02-14 DIAGNOSIS — R229 Localized swelling, mass and lump, unspecified: Secondary | ICD-10-CM

## 2016-02-14 DIAGNOSIS — M7502 Adhesive capsulitis of left shoulder: Secondary | ICD-10-CM | POA: Diagnosis not present

## 2016-02-14 DIAGNOSIS — R2232 Localized swelling, mass and lump, left upper limb: Secondary | ICD-10-CM | POA: Diagnosis not present

## 2016-02-14 DIAGNOSIS — M79622 Pain in left upper arm: Secondary | ICD-10-CM | POA: Diagnosis not present

## 2016-02-17 DIAGNOSIS — M7502 Adhesive capsulitis of left shoulder: Secondary | ICD-10-CM | POA: Diagnosis not present

## 2016-02-17 DIAGNOSIS — M542 Cervicalgia: Secondary | ICD-10-CM | POA: Diagnosis not present

## 2016-02-17 DIAGNOSIS — M7501 Adhesive capsulitis of right shoulder: Secondary | ICD-10-CM | POA: Diagnosis not present

## 2016-02-20 DIAGNOSIS — H2513 Age-related nuclear cataract, bilateral: Secondary | ICD-10-CM | POA: Diagnosis not present

## 2016-02-20 DIAGNOSIS — H40023 Open angle with borderline findings, high risk, bilateral: Secondary | ICD-10-CM | POA: Diagnosis not present

## 2016-02-20 DIAGNOSIS — H25043 Posterior subcapsular polar age-related cataract, bilateral: Secondary | ICD-10-CM | POA: Diagnosis not present

## 2016-02-20 DIAGNOSIS — H25013 Cortical age-related cataract, bilateral: Secondary | ICD-10-CM | POA: Diagnosis not present

## 2016-02-28 DIAGNOSIS — M7502 Adhesive capsulitis of left shoulder: Secondary | ICD-10-CM | POA: Diagnosis not present

## 2016-02-28 DIAGNOSIS — M25512 Pain in left shoulder: Secondary | ICD-10-CM | POA: Diagnosis not present

## 2016-02-28 DIAGNOSIS — M25612 Stiffness of left shoulder, not elsewhere classified: Secondary | ICD-10-CM | POA: Diagnosis not present

## 2016-03-03 DIAGNOSIS — M25512 Pain in left shoulder: Secondary | ICD-10-CM | POA: Diagnosis not present

## 2016-03-03 DIAGNOSIS — M25612 Stiffness of left shoulder, not elsewhere classified: Secondary | ICD-10-CM | POA: Diagnosis not present

## 2016-03-03 DIAGNOSIS — M7502 Adhesive capsulitis of left shoulder: Secondary | ICD-10-CM | POA: Diagnosis not present

## 2016-03-18 DIAGNOSIS — M7502 Adhesive capsulitis of left shoulder: Secondary | ICD-10-CM | POA: Diagnosis not present

## 2016-05-13 DIAGNOSIS — Z23 Encounter for immunization: Secondary | ICD-10-CM | POA: Diagnosis not present

## 2016-06-25 DIAGNOSIS — R0781 Pleurodynia: Secondary | ICD-10-CM | POA: Diagnosis not present

## 2016-07-02 ENCOUNTER — Other Ambulatory Visit: Payer: Self-pay | Admitting: Internal Medicine

## 2016-07-02 ENCOUNTER — Ambulatory Visit
Admission: RE | Admit: 2016-07-02 | Discharge: 2016-07-02 | Disposition: A | Discharge status: Home / Self Care | Payer: BLUE CROSS/BLUE SHIELD | Source: Ambulatory Visit | Attending: Internal Medicine | Admitting: Internal Medicine

## 2016-07-02 DIAGNOSIS — R079 Chest pain, unspecified: Secondary | ICD-10-CM

## 2016-07-02 DIAGNOSIS — R05 Cough: Secondary | ICD-10-CM | POA: Diagnosis not present

## 2016-07-02 DIAGNOSIS — M546 Pain in thoracic spine: Secondary | ICD-10-CM | POA: Diagnosis not present

## 2016-07-02 DIAGNOSIS — J209 Acute bronchitis, unspecified: Secondary | ICD-10-CM | POA: Diagnosis not present

## 2016-07-02 DIAGNOSIS — R0602 Shortness of breath: Secondary | ICD-10-CM | POA: Diagnosis not present

## 2016-07-07 DIAGNOSIS — Z79899 Other long term (current) drug therapy: Secondary | ICD-10-CM | POA: Diagnosis not present

## 2016-07-07 DIAGNOSIS — Z23 Encounter for immunization: Secondary | ICD-10-CM | POA: Diagnosis not present

## 2016-07-07 DIAGNOSIS — I10 Essential (primary) hypertension: Secondary | ICD-10-CM | POA: Diagnosis not present

## 2016-07-07 DIAGNOSIS — Z0000 Encounter for general adult medical examination without abnormal findings: Secondary | ICD-10-CM | POA: Diagnosis not present

## 2016-08-06 DIAGNOSIS — R748 Abnormal levels of other serum enzymes: Secondary | ICD-10-CM | POA: Diagnosis not present

## 2016-08-25 DIAGNOSIS — H25013 Cortical age-related cataract, bilateral: Secondary | ICD-10-CM | POA: Diagnosis not present

## 2016-08-25 DIAGNOSIS — H40023 Open angle with borderline findings, high risk, bilateral: Secondary | ICD-10-CM | POA: Diagnosis not present

## 2016-08-25 DIAGNOSIS — H5340 Unspecified visual field defects: Secondary | ICD-10-CM | POA: Diagnosis not present

## 2016-08-25 DIAGNOSIS — H2513 Age-related nuclear cataract, bilateral: Secondary | ICD-10-CM | POA: Diagnosis not present

## 2016-08-31 DIAGNOSIS — M7502 Adhesive capsulitis of left shoulder: Secondary | ICD-10-CM | POA: Diagnosis not present

## 2016-09-08 ENCOUNTER — Other Ambulatory Visit: Payer: Self-pay | Admitting: Obstetrics and Gynecology

## 2016-09-08 DIAGNOSIS — Z01419 Encounter for gynecological examination (general) (routine) without abnormal findings: Secondary | ICD-10-CM | POA: Diagnosis not present

## 2016-09-08 DIAGNOSIS — Z124 Encounter for screening for malignant neoplasm of cervix: Secondary | ICD-10-CM | POA: Diagnosis not present

## 2016-09-08 DIAGNOSIS — Z1231 Encounter for screening mammogram for malignant neoplasm of breast: Secondary | ICD-10-CM | POA: Diagnosis not present

## 2016-09-08 DIAGNOSIS — Z6824 Body mass index (BMI) 24.0-24.9, adult: Secondary | ICD-10-CM | POA: Diagnosis not present

## 2016-09-10 LAB — CYTOLOGY - PAP

## 2016-09-28 DIAGNOSIS — M7502 Adhesive capsulitis of left shoulder: Secondary | ICD-10-CM | POA: Diagnosis not present

## 2016-10-05 DIAGNOSIS — M25512 Pain in left shoulder: Secondary | ICD-10-CM | POA: Diagnosis not present

## 2016-10-07 DIAGNOSIS — M7502 Adhesive capsulitis of left shoulder: Secondary | ICD-10-CM | POA: Diagnosis not present

## 2016-10-15 ENCOUNTER — Other Ambulatory Visit: Payer: Self-pay | Admitting: Geriatric Medicine

## 2016-10-15 DIAGNOSIS — I10 Essential (primary) hypertension: Secondary | ICD-10-CM | POA: Diagnosis not present

## 2016-10-15 DIAGNOSIS — R1030 Lower abdominal pain, unspecified: Secondary | ICD-10-CM | POA: Diagnosis not present

## 2016-10-19 ENCOUNTER — Ambulatory Visit
Admission: RE | Admit: 2016-10-19 | Discharge: 2016-10-19 | Disposition: A | Discharge status: Home / Self Care | Payer: BLUE CROSS/BLUE SHIELD | Source: Ambulatory Visit | Attending: Geriatric Medicine | Admitting: Geriatric Medicine

## 2016-10-19 DIAGNOSIS — R1030 Lower abdominal pain, unspecified: Secondary | ICD-10-CM

## 2016-10-19 DIAGNOSIS — K449 Diaphragmatic hernia without obstruction or gangrene: Secondary | ICD-10-CM | POA: Diagnosis not present

## 2016-10-19 MED ORDER — IOPAMIDOL (ISOVUE-300) INJECTION 61%
100.0000 mL | Freq: Once | INTRAVENOUS | Status: AC | PRN
  Administered 2016-10-19: 100 mL via INTRAVENOUS

## 2016-10-29 DIAGNOSIS — R935 Abnormal findings on diagnostic imaging of other abdominal regions, including retroperitoneum: Secondary | ICD-10-CM | POA: Diagnosis not present

## 2016-11-25 DIAGNOSIS — K648 Other hemorrhoids: Secondary | ICD-10-CM | POA: Diagnosis not present

## 2016-11-25 DIAGNOSIS — D126 Benign neoplasm of colon, unspecified: Secondary | ICD-10-CM | POA: Diagnosis not present

## 2016-11-25 DIAGNOSIS — K635 Polyp of colon: Secondary | ICD-10-CM | POA: Diagnosis not present

## 2016-11-25 DIAGNOSIS — R933 Abnormal findings on diagnostic imaging of other parts of digestive tract: Secondary | ICD-10-CM | POA: Diagnosis not present

## 2016-12-01 DIAGNOSIS — K635 Polyp of colon: Secondary | ICD-10-CM | POA: Diagnosis not present

## 2016-12-01 DIAGNOSIS — D126 Benign neoplasm of colon, unspecified: Secondary | ICD-10-CM | POA: Diagnosis not present

## 2017-01-05 DIAGNOSIS — I10 Essential (primary) hypertension: Secondary | ICD-10-CM | POA: Diagnosis not present

## 2017-05-25 ENCOUNTER — Ambulatory Visit
Admission: RE | Admit: 2017-05-25 | Discharge: 2017-05-25 | Disposition: A | Discharge status: Home / Self Care | Payer: BLUE CROSS/BLUE SHIELD | Source: Ambulatory Visit | Attending: Nurse Practitioner | Admitting: Nurse Practitioner

## 2017-05-25 ENCOUNTER — Other Ambulatory Visit: Payer: Self-pay | Admitting: Nurse Practitioner

## 2017-05-25 DIAGNOSIS — R292 Abnormal reflex: Secondary | ICD-10-CM | POA: Diagnosis not present

## 2017-05-25 DIAGNOSIS — M5441 Lumbago with sciatica, right side: Secondary | ICD-10-CM

## 2017-05-25 DIAGNOSIS — M25571 Pain in right ankle and joints of right foot: Secondary | ICD-10-CM | POA: Diagnosis not present

## 2017-05-25 DIAGNOSIS — M48061 Spinal stenosis, lumbar region without neurogenic claudication: Secondary | ICD-10-CM | POA: Diagnosis not present

## 2017-05-25 DIAGNOSIS — Z23 Encounter for immunization: Secondary | ICD-10-CM | POA: Diagnosis not present

## 2017-06-07 ENCOUNTER — Other Ambulatory Visit (HOSPITAL_COMMUNITY): Payer: Self-pay | Admitting: Geriatric Medicine

## 2017-06-07 ENCOUNTER — Ambulatory Visit
Admission: RE | Admit: 2017-06-07 | Discharge: 2017-06-07 | Disposition: A | Discharge status: Home / Self Care | Payer: BLUE CROSS/BLUE SHIELD | Source: Ambulatory Visit | Attending: Geriatric Medicine | Admitting: Geriatric Medicine

## 2017-06-07 ENCOUNTER — Ambulatory Visit (HOSPITAL_COMMUNITY)
Admission: RE | Admit: 2017-06-07 | Discharge: 2017-06-07 | Disposition: A | Discharge status: Home / Self Care | Payer: BLUE CROSS/BLUE SHIELD | Source: Ambulatory Visit | Attending: Geriatric Medicine | Admitting: Geriatric Medicine

## 2017-06-07 ENCOUNTER — Other Ambulatory Visit: Payer: Self-pay | Admitting: Geriatric Medicine

## 2017-06-07 ENCOUNTER — Encounter (HOSPITAL_COMMUNITY): Payer: Self-pay

## 2017-06-07 DIAGNOSIS — R7989 Other specified abnormal findings of blood chemistry: Secondary | ICD-10-CM | POA: Diagnosis not present

## 2017-06-07 DIAGNOSIS — R059 Cough, unspecified: Secondary | ICD-10-CM

## 2017-06-07 DIAGNOSIS — R079 Chest pain, unspecified: Secondary | ICD-10-CM

## 2017-06-07 DIAGNOSIS — R05 Cough: Secondary | ICD-10-CM

## 2017-06-07 DIAGNOSIS — J439 Emphysema, unspecified: Secondary | ICD-10-CM | POA: Diagnosis not present

## 2017-06-07 DIAGNOSIS — M546 Pain in thoracic spine: Secondary | ICD-10-CM | POA: Diagnosis not present

## 2017-06-07 MED ORDER — IOPAMIDOL (ISOVUE-370) INJECTION 76%
INTRAVENOUS | Status: AC
  Administered 2017-06-07: 100 mL via INTRAVENOUS
  Filled 2017-06-07: qty 100

## 2017-06-29 ENCOUNTER — Other Ambulatory Visit: Payer: Self-pay | Admitting: Geriatric Medicine

## 2017-06-29 ENCOUNTER — Ambulatory Visit
Admission: RE | Admit: 2017-06-29 | Discharge: 2017-06-29 | Disposition: A | Discharge status: Home / Self Care | Payer: BLUE CROSS/BLUE SHIELD | Source: Ambulatory Visit | Attending: Geriatric Medicine | Admitting: Geriatric Medicine

## 2017-06-29 DIAGNOSIS — J189 Pneumonia, unspecified organism: Secondary | ICD-10-CM | POA: Diagnosis not present

## 2017-06-30 ENCOUNTER — Other Ambulatory Visit: Payer: Self-pay | Admitting: Geriatric Medicine

## 2017-06-30 DIAGNOSIS — R911 Solitary pulmonary nodule: Secondary | ICD-10-CM

## 2017-07-07 ENCOUNTER — Ambulatory Visit
Admission: RE | Admit: 2017-07-07 | Discharge: 2017-07-07 | Disposition: A | Discharge status: Home / Self Care | Payer: BLUE CROSS/BLUE SHIELD | Source: Ambulatory Visit | Attending: Geriatric Medicine | Admitting: Geriatric Medicine

## 2017-07-07 DIAGNOSIS — J189 Pneumonia, unspecified organism: Secondary | ICD-10-CM | POA: Diagnosis not present

## 2017-07-07 DIAGNOSIS — R911 Solitary pulmonary nodule: Secondary | ICD-10-CM

## 2017-08-06 DIAGNOSIS — J439 Emphysema, unspecified: Secondary | ICD-10-CM | POA: Diagnosis not present

## 2017-08-06 DIAGNOSIS — Z0000 Encounter for general adult medical examination without abnormal findings: Secondary | ICD-10-CM | POA: Diagnosis not present

## 2017-08-24 DIAGNOSIS — J439 Emphysema, unspecified: Secondary | ICD-10-CM | POA: Diagnosis not present

## 2017-09-15 DIAGNOSIS — Z01419 Encounter for gynecological examination (general) (routine) without abnormal findings: Secondary | ICD-10-CM | POA: Diagnosis not present

## 2017-09-15 DIAGNOSIS — Z1231 Encounter for screening mammogram for malignant neoplasm of breast: Secondary | ICD-10-CM | POA: Diagnosis not present

## 2017-09-15 DIAGNOSIS — Z124 Encounter for screening for malignant neoplasm of cervix: Secondary | ICD-10-CM | POA: Diagnosis not present

## 2017-09-15 DIAGNOSIS — Z6824 Body mass index (BMI) 24.0-24.9, adult: Secondary | ICD-10-CM | POA: Diagnosis not present

## 2017-10-13 DIAGNOSIS — Z780 Asymptomatic menopausal state: Secondary | ICD-10-CM | POA: Diagnosis not present

## 2017-10-13 DIAGNOSIS — M8588 Other specified disorders of bone density and structure, other site: Secondary | ICD-10-CM | POA: Diagnosis not present

## 2017-10-15 DIAGNOSIS — E559 Vitamin D deficiency, unspecified: Secondary | ICD-10-CM | POA: Diagnosis not present

## 2017-12-13 DIAGNOSIS — J209 Acute bronchitis, unspecified: Secondary | ICD-10-CM | POA: Diagnosis not present

## 2017-12-13 DIAGNOSIS — R05 Cough: Secondary | ICD-10-CM | POA: Diagnosis not present

## 2017-12-13 DIAGNOSIS — R0989 Other specified symptoms and signs involving the circulatory and respiratory systems: Secondary | ICD-10-CM | POA: Diagnosis not present

## 2018-02-17 DIAGNOSIS — R05 Cough: Secondary | ICD-10-CM | POA: Diagnosis not present

## 2018-02-17 DIAGNOSIS — R252 Cramp and spasm: Secondary | ICD-10-CM | POA: Diagnosis not present

## 2018-02-17 DIAGNOSIS — Z79899 Other long term (current) drug therapy: Secondary | ICD-10-CM | POA: Diagnosis not present

## 2018-02-17 DIAGNOSIS — I10 Essential (primary) hypertension: Secondary | ICD-10-CM | POA: Diagnosis not present

## 2018-05-17 DIAGNOSIS — Z23 Encounter for immunization: Secondary | ICD-10-CM | POA: Diagnosis not present

## 2018-06-07 DIAGNOSIS — J209 Acute bronchitis, unspecified: Secondary | ICD-10-CM | POA: Diagnosis not present

## 2018-08-22 DIAGNOSIS — Z79899 Other long term (current) drug therapy: Secondary | ICD-10-CM | POA: Diagnosis not present

## 2018-08-22 DIAGNOSIS — E559 Vitamin D deficiency, unspecified: Secondary | ICD-10-CM | POA: Diagnosis not present

## 2018-08-22 DIAGNOSIS — Z0000 Encounter for general adult medical examination without abnormal findings: Secondary | ICD-10-CM | POA: Diagnosis not present

## 2018-08-22 DIAGNOSIS — I10 Essential (primary) hypertension: Secondary | ICD-10-CM | POA: Diagnosis not present

## 2018-08-30 DIAGNOSIS — H6122 Impacted cerumen, left ear: Secondary | ICD-10-CM | POA: Diagnosis not present

## 2018-09-19 DIAGNOSIS — Z6824 Body mass index (BMI) 24.0-24.9, adult: Secondary | ICD-10-CM | POA: Diagnosis not present

## 2018-09-19 DIAGNOSIS — Z01419 Encounter for gynecological examination (general) (routine) without abnormal findings: Secondary | ICD-10-CM | POA: Diagnosis not present

## 2018-09-19 DIAGNOSIS — Z1231 Encounter for screening mammogram for malignant neoplasm of breast: Secondary | ICD-10-CM | POA: Diagnosis not present

## 2018-09-19 DIAGNOSIS — Z124 Encounter for screening for malignant neoplasm of cervix: Secondary | ICD-10-CM | POA: Diagnosis not present

## 2018-09-27 DIAGNOSIS — R87615 Unsatisfactory cytologic smear of cervix: Secondary | ICD-10-CM | POA: Diagnosis not present

## 2019-01-17 DIAGNOSIS — H40033 Anatomical narrow angle, bilateral: Secondary | ICD-10-CM | POA: Diagnosis not present

## 2019-01-17 DIAGNOSIS — H0102A Squamous blepharitis right eye, upper and lower eyelids: Secondary | ICD-10-CM | POA: Diagnosis not present

## 2019-01-17 DIAGNOSIS — H2513 Age-related nuclear cataract, bilateral: Secondary | ICD-10-CM | POA: Diagnosis not present

## 2019-01-17 DIAGNOSIS — H40023 Open angle with borderline findings, high risk, bilateral: Secondary | ICD-10-CM | POA: Diagnosis not present

## 2019-02-24 DIAGNOSIS — I10 Essential (primary) hypertension: Secondary | ICD-10-CM | POA: Diagnosis not present

## 2019-06-26 DIAGNOSIS — Z03818 Encounter for observation for suspected exposure to other biological agents ruled out: Secondary | ICD-10-CM | POA: Diagnosis not present

## 2019-09-06 DIAGNOSIS — Z79899 Other long term (current) drug therapy: Secondary | ICD-10-CM | POA: Diagnosis not present

## 2019-09-06 DIAGNOSIS — Z1159 Encounter for screening for other viral diseases: Secondary | ICD-10-CM | POA: Diagnosis not present

## 2019-09-06 DIAGNOSIS — R202 Paresthesia of skin: Secondary | ICD-10-CM | POA: Diagnosis not present

## 2019-09-06 DIAGNOSIS — Z0000 Encounter for general adult medical examination without abnormal findings: Secondary | ICD-10-CM | POA: Diagnosis not present

## 2019-09-06 DIAGNOSIS — I10 Essential (primary) hypertension: Secondary | ICD-10-CM | POA: Diagnosis not present

## 2019-09-06 DIAGNOSIS — R300 Dysuria: Secondary | ICD-10-CM | POA: Diagnosis not present

## 2019-09-06 DIAGNOSIS — E559 Vitamin D deficiency, unspecified: Secondary | ICD-10-CM | POA: Diagnosis not present

## 2019-09-20 DIAGNOSIS — R309 Painful micturition, unspecified: Secondary | ICD-10-CM | POA: Diagnosis not present

## 2019-09-20 DIAGNOSIS — Z124 Encounter for screening for malignant neoplasm of cervix: Secondary | ICD-10-CM | POA: Diagnosis not present

## 2019-09-20 DIAGNOSIS — Z6823 Body mass index (BMI) 23.0-23.9, adult: Secondary | ICD-10-CM | POA: Diagnosis not present

## 2019-09-20 DIAGNOSIS — Z01419 Encounter for gynecological examination (general) (routine) without abnormal findings: Secondary | ICD-10-CM | POA: Diagnosis not present

## 2019-09-20 DIAGNOSIS — Z1231 Encounter for screening mammogram for malignant neoplasm of breast: Secondary | ICD-10-CM | POA: Diagnosis not present

## 2019-10-03 DIAGNOSIS — Z800 Family history of malignant neoplasm of digestive organs: Secondary | ICD-10-CM | POA: Diagnosis not present

## 2019-10-03 DIAGNOSIS — K56699 Other intestinal obstruction unspecified as to partial versus complete obstruction: Secondary | ICD-10-CM | POA: Diagnosis not present

## 2019-10-03 DIAGNOSIS — Z86010 Personal history of colonic polyps: Secondary | ICD-10-CM | POA: Diagnosis not present

## 2019-10-03 DIAGNOSIS — Z8371 Family history of colonic polyps: Secondary | ICD-10-CM | POA: Diagnosis not present

## 2019-11-21 DIAGNOSIS — Z1159 Encounter for screening for other viral diseases: Secondary | ICD-10-CM | POA: Diagnosis not present

## 2019-11-24 DIAGNOSIS — K635 Polyp of colon: Secondary | ICD-10-CM | POA: Diagnosis not present

## 2019-11-24 DIAGNOSIS — Z86010 Personal history of colonic polyps: Secondary | ICD-10-CM | POA: Diagnosis not present

## 2019-11-24 DIAGNOSIS — K621 Rectal polyp: Secondary | ICD-10-CM | POA: Diagnosis not present

## 2019-11-24 DIAGNOSIS — K648 Other hemorrhoids: Secondary | ICD-10-CM | POA: Diagnosis not present

## 2019-12-07 ENCOUNTER — Ambulatory Visit
Admission: RE | Admit: 2019-12-07 | Discharge: 2019-12-07 | Disposition: A | Discharge status: Home / Self Care | Payer: BC Managed Care – PPO | Source: Ambulatory Visit | Attending: Geriatric Medicine | Admitting: Geriatric Medicine

## 2019-12-07 ENCOUNTER — Other Ambulatory Visit: Payer: Self-pay | Admitting: Geriatric Medicine

## 2019-12-07 DIAGNOSIS — R05 Cough: Secondary | ICD-10-CM | POA: Diagnosis not present

## 2019-12-07 DIAGNOSIS — I10 Essential (primary) hypertension: Secondary | ICD-10-CM | POA: Diagnosis not present

## 2019-12-07 DIAGNOSIS — R059 Cough, unspecified: Secondary | ICD-10-CM

## 2019-12-07 DIAGNOSIS — R21 Rash and other nonspecific skin eruption: Secondary | ICD-10-CM | POA: Diagnosis not present

## 2020-01-24 ENCOUNTER — Ambulatory Visit: Payer: BC Managed Care – PPO | Admitting: Podiatry

## 2020-01-24 ENCOUNTER — Other Ambulatory Visit: Payer: Self-pay

## 2020-01-24 ENCOUNTER — Encounter: Payer: Self-pay | Admitting: Podiatry

## 2020-01-24 ENCOUNTER — Ambulatory Visit (INDEPENDENT_AMBULATORY_CARE_PROVIDER_SITE_OTHER): Payer: BC Managed Care – PPO

## 2020-01-24 DIAGNOSIS — M79671 Pain in right foot: Secondary | ICD-10-CM | POA: Diagnosis not present

## 2020-01-24 DIAGNOSIS — M7751 Other enthesopathy of right foot: Secondary | ICD-10-CM

## 2020-01-24 DIAGNOSIS — H40023 Open angle with borderline findings, high risk, bilateral: Secondary | ICD-10-CM | POA: Diagnosis not present

## 2020-01-24 DIAGNOSIS — H2513 Age-related nuclear cataract, bilateral: Secondary | ICD-10-CM | POA: Diagnosis not present

## 2020-01-24 DIAGNOSIS — H40033 Anatomical narrow angle, bilateral: Secondary | ICD-10-CM | POA: Diagnosis not present

## 2020-01-24 DIAGNOSIS — H0102A Squamous blepharitis right eye, upper and lower eyelids: Secondary | ICD-10-CM | POA: Diagnosis not present

## 2020-01-24 DIAGNOSIS — M779 Enthesopathy, unspecified: Secondary | ICD-10-CM | POA: Diagnosis not present

## 2020-01-24 MED ORDER — MELOXICAM 15 MG PO TABS: 15.0000 mg | ORAL_TABLET | Freq: Every day | ORAL | 1 refills | Status: DC

## 2020-01-24 NOTE — Progress Notes (Signed)
   HPI: 64 y.o. female presenting today as a new patient for evaluation of right foot pain.  Patient states she has had right foot pain for approximately 2-3 months now.  Sudden onset.  She denies injury or trauma.  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  She continues to have pain and is here for further treatment evaluation  Past Medical History:  Diagnosis Date  . Bullous emphysema ([place])   . Hypertension      Physical Exam: General: The patient is alert and oriented x3 in no acute distress.  Dermatology: Skin is warm, dry and supple bilateral lower extremities. Negative for open lesions or macerations.  Vascular: Palpable pedal pulses bilaterally. No edema or erythema noted. Capillary refill within normal limits.  Neurological: Epicritic and protective threshold grossly intact bilaterally.   Musculoskeletal Exam: Range of motion within normal limits to all pedal and ankle joints bilateral. Muscle strength 5/5 in all groups bilateral. Pain with Palpation and ROM 2nd MTPJ right foot  Radiographic Exam:  Normal osseous mineralization. Joint spaces preserved. No fracture/dislocation/boney destruction.    Assessment: 1. 2nd MTPJ Capsulitis right foot   Plan of Care:  1. Patient evaluated. X-Rays reviewed.  2. Declined injections today 3. Rx meloxicam 15mg  daily 4. Recommend good supportive shoes. Patient currently wearing sketchers. Recommend going to [employer] running store 5. RTC as needed.  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       Edrick Kins, DPM [facility]  Dr. Edrick Kins, DPM    [address],  [address]                Office [phone]  Fax [phone]

## 2020-01-25 ENCOUNTER — Other Ambulatory Visit: Payer: Self-pay | Admitting: Podiatry

## 2020-01-25 DIAGNOSIS — M779 Enthesopathy, unspecified: Secondary | ICD-10-CM

## 2020-02-14 ENCOUNTER — Encounter: Payer: Self-pay | Admitting: Pulmonary Disease

## 2020-02-14 ENCOUNTER — Other Ambulatory Visit: Payer: Self-pay

## 2020-02-14 ENCOUNTER — Ambulatory Visit: Payer: BC Managed Care – PPO | Admitting: Pulmonary Disease

## 2020-02-14 VITALS — BP 116/68 | HR 73 | Temp 97.8°F | Ht 61.5 in | Wt 123.2 lb

## 2020-02-14 DIAGNOSIS — J439 Emphysema, unspecified: Secondary | ICD-10-CM | POA: Diagnosis not present

## 2020-02-14 LAB — CBC WITH DIFFERENTIAL/PLATELET
Basophils Absolute: 0 10*3/uL (ref 0.0–0.1)
Basophils Relative: 0.6 % (ref 0.0–3.0)
Eosinophils Absolute: 0.3 10*3/uL (ref 0.0–0.7)
Eosinophils Relative: 3.9 % (ref 0.0–5.0)
HCT: 37.7 % (ref 36.0–46.0)
Hemoglobin: 12.7 g/dL (ref 12.0–15.0)
Lymphocytes Relative: 29.9 % (ref 12.0–46.0)
Lymphs Abs: 2.3 10*3/uL (ref 0.7–4.0)
MCHC: 33.6 g/dL (ref 30.0–36.0)
MCV: 92.5 fl (ref 78.0–100.0)
Monocytes Absolute: 0.5 10*3/uL (ref 0.1–1.0)
Monocytes Relative: 6.3 % (ref 3.0–12.0)
Neutro Abs: 4.7 10*3/uL (ref 1.4–7.7)
Neutrophils Relative %: 59.3 % (ref 43.0–77.0)
Platelets: 238 10*3/uL (ref 150.0–400.0)
RBC: 4.08 Mil/uL (ref 3.87–5.11)
RDW: 13.6 % (ref 11.5–15.5)
WBC: 7.8 10*3/uL (ref 4.0–10.5)

## 2020-02-14 NOTE — Progress Notes (Signed)
Teresa Woodward    [id number]    [birth year]  Primary Care Physician:Stoneking, Christiane Ha, MD  Referring Physician: Lajean Manes, MD [address] [place],  [place] [address]  Chief complaint: Consult for emphysema, apical bulla  HPI: 65 year old with history of bullous emphysema.  Previously followed by Dr.Clance  Not on any inhalers Complains of mild cough, chest tightness for the past 6 months.  She wants her lungs checked out again History notable for allergies, postnasal drip occasional heartburn for which she is taking antiacid medication. 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  She has a down comforter which she uses very occasionally Smoking history: 5-pack-year smoker.  Quit in 1998 Travel history: Originally from [place].  No significant recent travel Relevant family history: Dad had lung cancer.  He was a smoker.   Outpatient Encounter Medications as of 02/14/2020  Medication Sig  . acetaminophen-codeine (TYLENOL #3) 300-30 MG per tablet Take 1-2 tablets by mouth every 6 (six) hours as needed for moderate pain.  Marland Kitchen amLODipine (NORVASC) 5 MG tablet amlodipine 5 mg tablet  . Cholecalciferol (VITAMIN D3 PO) Take 2,000 Units by mouth daily.  . hydrochlorothiazide (MICROZIDE) 12.5 MG capsule Take 12.5 mg by mouth daily.  . meloxicam (MOBIC) 15 MG tablet Take 1 tablet (15 mg total) by mouth daily.  Marland Kitchen zolpidem (AMBIEN) 10 MG tablet Take 5 mg by mouth at bedtime as needed. For sleep    No facility-administered encounter medications on file as of 02/14/2020.    Allergies as of 02/14/2020 - Review Complete 02/14/2020  Allergen Reaction Noted  . Sulfa antibiotics Other (See Comments) 07/21/2011    Past Medical History:  Diagnosis Date  . Bullous emphysema ([place])   . Hypertension     Past Surgical History:  Procedure Laterality Date  . HAND SURGERY    . NOVASURE ABLATION       Family History  Problem Relation Age of Onset  . Breast cancer Mother 8  . Colon cancer Father 6       smoker  . Breast cancer Sister        dx in her 72s; paternal half sister  . Prostate cancer Brother        dx in his 43s; full sibling  . Brain cancer Maternal Aunt        dx in her 68s  . Breast cancer Sister        dx in her 48s; paternal half sister  . Prostate cancer Brother        dx in his 39s; full sibling  . Brain cancer Maternal Aunt        dx in her 26s  . Prostate cancer Cousin        maternal cousin    Social History   Socioeconomic History  . Marital status: Married    Spouse name: Not on file  . Number of children: Not on file  . Years of education: Not on file  . Highest education level: Not on file  Occupational History  . Not on file  Tobacco Use  . Smoking status: Former Smoker    Packs/day: 0.25    Years: 5.00    Pack years: 1.25  . Smokeless tobacco: Former Systems developer    Quit date: 11/01/1995  Substance and Sexual Activity  . Alcohol use: Yes    Alcohol/week: 7.0  standard drinks    Types: 7 drink(s) per week  . Drug use: Not on file  . Sexual activity: Not on file  Other Topics Concern  . Not on file  Social History Narrative  . Not on file   Social Determinants of Health   Financial Resource Strain:   . Difficulty of Paying Living Expenses:   Food Insecurity:   . Worried About Charity fundraiser in the Last Year:   . Arboriculturist in the Last Year:   Transportation Needs:   . Film/video editor (Medical):   Marland Kitchen Lack of Transportation (Non-Medical):   Physical Activity:   . Days of Exercise per Week:   . Minutes of Exercise per Session:   Stress:   . Feeling of Stress :   Social Connections:   . Frequency of Communication with Friends and Family:   . Frequency of Social Gatherings with Friends and Family:   . Attends Religious Services:   . Active Member of Clubs or Organizations:   . Attends Archivist  Meetings:   Marland Kitchen Marital Status:   Intimate Partner Violence:   . Fear of Current or Ex-Partner:   . Emotionally Abused:   Marland Kitchen Physically Abused:   . Sexually Abused:     Review of systems: Review of Systems  Constitutional: Negative for fever and chills.  HENT: Negative.   Eyes: Negative for blurred vision.  Respiratory: as per HPI  Cardiovascular: Negative for chest pain and palpitations.  Gastrointestinal: Negative for vomiting, diarrhea, blood per rectum. Genitourinary: Negative for dysuria, urgency, frequency and hematuria.  Musculoskeletal: Negative for myalgias, back pain and joint pain.  Skin: Negative for itching and rash.  Neurological: Negative for dizziness, tremors, focal weakness, seizures and loss of consciousness.  Endo/Heme/Allergies: Negative for environmental allergies.  Psychiatric/Behavioral: Negative for depression, suicidal ideas and hallucinations.  All other systems reviewed and are negative.  Physical Exam: Blood pressure 116/68, pulse 73, temperature 97.8 F (36.6 C), temperature source Oral, height 5' 1.5" (1.562 m), weight 123 lb 3.2 oz (55.9 kg), SpO2 99 %. Gen:      No acute distress HEENT:  EOMI, sclera anicteric Neck:     No masses; no thyromegaly Lungs:    Clear to auscultation bilaterally; normal respiratory effort CV:         Regular rate and rhythm; no murmurs Abd:      + bowel sounds; soft, non-tender; no palpable masses, no distension Ext:    No edema; adequate peripheral perfusion Skin:      Warm and dry; no rash Neuro: alert and oriented x 3 Psych: normal mood and affect  Data Reviewed: Imaging CT chest 11/04/2012-stable biapical scarring with bullous emphysema. CT chest 06/07/2017-confluent airspace opacities with patchy groundglass in the upper lobe. CT chest 07/07/2017-improvement in airspace opacities with residual mild scarring, emphysema with bullous changes in the apex. Chest x-ray 12/07/2019-emphysema. I have reviewed the images  personally.  PFTs:  Labs:  Assessment:  Bullous emphysema Lung images reviewed with stable changes of biapical scarring with bullous emphysema. Chest x-ray this year with no significant changes Suspect her cough may be secondary to upper airway cough syndrome, GERD  Check CBC differential, IgE and PFTs for reevaluation Send alpha-1 antitrypsin levels as she has minimal smoking history  Plan/Recommendations: CBC, IgE, alpha-1 antitrypsin PFTs  Marshell Garfinkel MD [facility] 02/14/2020, 9:42 AM  CC: Teresa Manes, MD

## 2020-02-14 NOTE — Patient Instructions (Signed)
Check CBC with differential, IgE, alpha-1 antitrypsin levels and phenotype Schedule pulmonary function test  Follow-up in 3 months.

## 2020-02-20 LAB — ALPHA-1 ANTITRYPSIN PHENOTYPE: A-1 Antitrypsin, Ser: 123 mg/dL (ref 83–199)

## 2020-02-20 LAB — IGE: IgE (Immunoglobulin E), Serum: 106 kU/L (ref <=114)

## 2020-03-04 ENCOUNTER — Other Ambulatory Visit: Payer: Self-pay

## 2020-03-04 ENCOUNTER — Ambulatory Visit (INDEPENDENT_AMBULATORY_CARE_PROVIDER_SITE_OTHER): Payer: BC Managed Care – PPO | Admitting: Pulmonary Disease

## 2020-03-04 DIAGNOSIS — J439 Emphysema, unspecified: Secondary | ICD-10-CM | POA: Diagnosis not present

## 2020-03-04 LAB — PULMONARY FUNCTION TEST
DL/VA % pred: 87 %
DL/VA: 3.74 ml/min/mmHg/L
DLCO cor % pred: 77 %
DLCO cor: 14.86 ml/min/mmHg
DLCO unc % pred: 75 %
DLCO unc: 14.53 ml/min/mmHg
FEF 25-75 Post: 1.19 L/sec
FEF 25-75 Pre: 0.93 L/sec
FEF2575-%Change-Post: 27 %
FEF2575-%Pred-Post: 61 %
FEF2575-%Pred-Pre: 48 %
FEV1-%Change-Post: 7 %
FEV1-%Pred-Post: 97 %
FEV1-%Pred-Pre: 90 %
FEV1-Post: 1.88 L
FEV1-Pre: 1.75 L
FEV1FVC-%Change-Post: 3 %
FEV1FVC-%Pred-Pre: 82 %
FEV6-%Change-Post: 4 %
FEV6-%Pred-Post: 116 %
FEV6-%Pred-Pre: 111 %
FEV6-Post: 2.78 L
FEV6-Pre: 2.65 L
FEV6FVC-%Change-Post: 0 %
FEV6FVC-%Pred-Post: 103 %
FEV6FVC-%Pred-Pre: 102 %
FVC-%Change-Post: 4 %
FVC-%Pred-Post: 113 %
FVC-%Pred-Pre: 108 %
FVC-Post: 2.79 L
FVC-Pre: 2.68 L
Post FEV1/FVC ratio: 67 %
Post FEV6/FVC ratio: 99 %
Pre FEV1/FVC ratio: 65 %
Pre FEV6/FVC Ratio: 99 %
RV % pred: 62 %
RV: 1.2 L
TLC % pred: 80 %
TLC: 3.88 L

## 2020-03-04 NOTE — Progress Notes (Signed)
PFT done today. 

## 2020-03-20 ENCOUNTER — Other Ambulatory Visit: Payer: Self-pay | Admitting: Podiatry

## 2020-04-18 DIAGNOSIS — Z23 Encounter for immunization: Secondary | ICD-10-CM | POA: Diagnosis not present

## 2020-06-14 ENCOUNTER — Encounter: Payer: Self-pay | Admitting: Pulmonary Disease

## 2020-06-14 ENCOUNTER — Ambulatory Visit: Payer: BC Managed Care – PPO | Admitting: Pulmonary Disease

## 2020-06-14 ENCOUNTER — Other Ambulatory Visit: Payer: Self-pay

## 2020-06-14 VITALS — BP 120/72 | HR 85 | Temp 97.3°F | Ht 61.0 in | Wt 127.2 lb

## 2020-06-14 DIAGNOSIS — J449 Chronic obstructive pulmonary disease, unspecified: Secondary | ICD-10-CM

## 2020-06-14 NOTE — Patient Instructions (Signed)
Glad you are doing better with your breathing Your PFTs show very minimal changes of COPD I do not believe you need to be on inhalers  I will follow back with you in 6 to 7 months.

## 2020-06-14 NOTE — Progress Notes (Signed)
She is doing well               Teresa Woodward    [id number]    1955/08/15  Primary [place] Physician:Stoneking, Teresa Ha, MD  Referring Physician: Lajean [place], Teresa [place]. [address] [place],  [place] [address]  Chief complaint: Consult for emphysema, apical bulla  HPI: 64 year old with history of bullous emphysema.  Previously followed by Dr.Clance  Not on any inhalers Complains of mild cough, chest tightness for the past 6 months.  She wants her lungs checked out again History notable for allergies, postnasal drip occasional heartburn for which she is taking antiacid medication. 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  She has a down comforter which she uses very occasionally Smoking history: 5-pack-year smoker.  Quit in 1998 Travel history: Originally from [place].  No significant recent travel Relevant family history: Dad had lung cancer.  He was a smoker.  Interim history:  She is doing well with her breathing Dyspnea is improved.  She feels that her shortness of breath is due to heat and humidity during summer.   Outpatient Encounter Medications as of 06/14/2020  Medication Sig  . acetaminophen-codeine (TYLENOL #3) 300-30 MG per tablet Take 1-2 tablets by mouth every 6 (six) hours as needed for moderate pain.  Marland Kitchen amLODipine (NORVASC) 5 MG tablet amlodipine 5 mg tablet  . Cholecalciferol (VITAMIN D3 PO) Take 2,000 Units by mouth daily.  . hydrochlorothiazide (MICROZIDE) 12.5 MG capsule Take 12.5 mg by mouth daily.  Marland Kitchen zolpidem (AMBIEN) 10 MG tablet Take 5 mg by mouth at bedtime as needed. For sleep   . meloxicam (MOBIC) 15 MG tablet TAKE 1 TABLET BY MOUTH EVERY DAY (Patient not taking: Reported on 06/14/2020)   No facility-administered encounter medications on file as of 06/14/2020.   Physical Exam: Blood pressure 116/68, pulse 73, temperature 97.8 F (36.6 C), temperature source Oral, height 5'  1.5" (1.562 m), weight 123 lb 3.2 oz (55.9 kg), SpO2 99 %. Gen:      No acute distress HEENT:  EOMI, sclera anicteric Neck:     No masses; no thyromegaly Lungs:    Clear to auscultation bilaterally; normal respiratory effort CV:         Regular rate and rhythm; no murmurs Abd:      + bowel sounds; soft, non-tender; no palpable masses, no distension Ext:    No edema; adequate peripheral perfusion Skin:      Warm and dry; no rash Neuro: alert and oriented x 3 Psych: normal mood and affect  Data Reviewed: Imaging CT chest 11/04/2012-stable biapical scarring with bullous emphysema. CT chest 06/07/2017-confluent airspace opacities with patchy groundglass in the upper lobe. CT chest 07/07/2017-improvement in airspace opacities with residual mild scarring, emphysema with bullous changes in the apex. Chest x-ray 12/07/2019-emphysema. I have reviewed the images personally.  PFTs: 03/04/2020 FVC 2.79 [113%), FEV1 1.88 [97%], F/F 67, TLC 3.88 [80%], DLCO 14.53 [75%] Mild obstruction with minimal diffusion defect  Labs: CBC 02/14/2020-WBC 7.8, eos 3.9%, absolute eosinophil count 304 IgE 02/14/2020-106 Alpha-1 antitrypsin 02/14/2020-123, PI MM  Assessment:  Mild COPD, emphysema She has mild obstruction with bullous emphysema on lung imaging Currently asymptomatic and does not need inhalers Suspect her cough may be secondary to upper airway cough syndrome, GERD  Reevaluate in 6 months as she feels that the heat and humidity of summer makes her breathing worse If she is doing  okay at return visit then she can be discharged from pulmonary clinic.  Ex-smoker Does not meet criteria for screening CTs  Plan/Recommendations: Follow-up in 6 months.  Teresa Garfinkel MD Teresa [place]ward 06/14/2020, 4:02 PM  CC: Teresa Manes, MD

## 2020-07-17 DIAGNOSIS — Z20822 Contact with and (suspected) exposure to covid-19: Secondary | ICD-10-CM | POA: Diagnosis not present

## 2020-08-09 DIAGNOSIS — R002 Palpitations: Secondary | ICD-10-CM | POA: Diagnosis not present

## 2020-08-09 DIAGNOSIS — I10 Essential (primary) hypertension: Secondary | ICD-10-CM | POA: Diagnosis not present

## 2020-08-27 DIAGNOSIS — R002 Palpitations: Secondary | ICD-10-CM | POA: Diagnosis not present

## 2020-09-08 ENCOUNTER — Encounter (HOSPITAL_COMMUNITY): Payer: Self-pay | Admitting: Emergency Medicine

## 2020-09-08 ENCOUNTER — Emergency Department (HOSPITAL_COMMUNITY)
Admission: EM | Admit: 2020-09-08 | Discharge: 2020-09-08 | Disposition: A | Discharge status: Home / Self Care | Payer: BC Managed Care – PPO | Attending: Emergency Medicine | Admitting: Emergency Medicine

## 2020-09-08 ENCOUNTER — Other Ambulatory Visit: Payer: Self-pay

## 2020-09-08 ENCOUNTER — Emergency Department (HOSPITAL_COMMUNITY): Payer: BC Managed Care – PPO

## 2020-09-08 DIAGNOSIS — R519 Headache, unspecified: Secondary | ICD-10-CM | POA: Insufficient documentation

## 2020-09-08 DIAGNOSIS — Z87891 Personal history of nicotine dependence: Secondary | ICD-10-CM | POA: Insufficient documentation

## 2020-09-08 DIAGNOSIS — I10 Essential (primary) hypertension: Secondary | ICD-10-CM | POA: Insufficient documentation

## 2020-09-08 DIAGNOSIS — I158 Other secondary hypertension: Secondary | ICD-10-CM | POA: Insufficient documentation

## 2020-09-08 DIAGNOSIS — Z79899 Other long term (current) drug therapy: Secondary | ICD-10-CM | POA: Diagnosis not present

## 2020-09-08 MED ORDER — PROCHLORPERAZINE MALEATE 10 MG PO TABS
10.0000 mg | ORAL_TABLET | Freq: Once | ORAL | Status: AC
  Administered 2020-09-08: 10 mg via ORAL
  Filled 2020-09-08: qty 1

## 2020-09-08 NOTE — ED Triage Notes (Signed)
Patient states her head started hurting around 5pm today. This is when she went to bed. Patient states that her blood pressure has been high also. Patient got up this morning and head still hurting.

## 2020-09-08 NOTE — ED Provider Notes (Signed)
[place] [facility] DEPT Provider Note  CSN: [id number] Arrival date & time: 09/08/20 0017  Chief Complaint(s) No chief complaint on file.  HPI Teresa Woodward is a 65 y.o. female   The history is provided by the patient.  Headache Pain location:  Occipital Quality:  Dull Radiates to:  Does not radiate Pain severity now: moderate. Onset quality:  Gradual Duration:  6 hours Timing:  Constant Progression:  Unchanged Chronicity:  New Context comment:  Elevated BP Relieved by:  Nothing Worsened by:  Nothing Associated symptoms: no blurred vision, no congestion, no cough, no dizziness, no fatigue, no fever, no neck stiffness, no numbness, no paresthesias, no photophobia and no vomiting     She reports forgetting to take her BP medication for the past 4 days. Noted that her sBP was in 150s today.  Took her BP meds prior to coming.  Past Medical History Past Medical History:  Diagnosis Date  . 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 Hypertension    Patient Active Problem List   Diagnosis Date Noted  . 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 Authorizing Provider  acetaminophen (TYLENOL) 650 MG CR tablet Take 650 mg by mouth every 8 (eight) hours as needed for pain.   Yes [provider]  amLODipine (NORVASC) 5 MG tablet Take 5 mg by mouth daily.   Yes [provider]  Cholecalciferol (VITAMIN D3 PO) Take 2,000 Units by mouth daily.   Yes [provider]  hydrochlorothiazide (MICROZIDE) 12.5 MG capsule Take 12.5 mg by mouth daily.   Yes [provider]  zolpidem (AMBIEN) 10 MG tablet Take 5 mg by mouth at bedtime as needed. For sleep    Yes [provider]  meloxicam (MOBIC) 15 MG tablet TAKE 1 TABLET BY MOUTH EVERY DAY Patient not taking: No sig reported 03/20/20   Edrick Kins, DPM                                                                                                                                     Past Surgical History Past Surgical History:  Procedure Laterality Date  . HAND SURGERY    . NOVASURE ABLATION     Family History Family History  Problem Relation Age of Onset  . Breast cancer Mother 29  . Colon cancer Father 36       smoker  . Breast cancer Sister        dx in her 54s; paternal half sister  . Prostate cancer Brother        dx in his 30s; full sibling  . Brain cancer Maternal Aunt        dx in her 65s  . Breast cancer Sister        dx in her 68s; paternal half sister  . Prostate cancer Brother  dx in his 36s; full sibling  . Brain cancer Maternal Aunt        dx in her 12s  . Prostate cancer Cousin        maternal cousin    Social History Social History   Tobacco Use  . Smoking status: Former Smoker    Packs/day: 0.25    Years: 5.00    Pack years: 1.25  . Smokeless tobacco: Former Systems developer    Quit date: 11/01/1995  Substance Use Topics  . Alcohol use: Yes    Alcohol/week: 7.0 standard drinks    Types: 7 drink(s) per week   Allergies Sulfa antibiotics  Review of Systems Review of Systems  Constitutional: Negative for fatigue and fever.  HENT: Negative for congestion.   Eyes: Negative for blurred vision and photophobia.  Respiratory: Negative for cough and shortness of breath.   Cardiovascular: Negative for chest pain and leg swelling.  Gastrointestinal: Negative for vomiting.  Musculoskeletal: Negative for neck stiffness.  Neurological: Positive for headaches. Negative for dizziness, numbness and paresthesias.   All other systems are reviewed and are negative for acute change except as noted in the HPI  Physical Exam Vital Signs  I have reviewed the triage vital signs BP (!) 140/97   Pulse 70   Temp 97.6 F (36.4 C) (Oral)   Resp 20   Ht 5\' 1"  (1.549 m)   Wt 55.8 kg   SpO2 100%   BMI 23.24 kg/m   Physical Exam Vitals reviewed.  Constitutional:       General: She is not in acute distress.    Appearance: She is well-developed and well-nourished. She is not diaphoretic.  HENT:     Head: Normocephalic and atraumatic.     Nose: Nose normal.  Eyes:     General: No scleral icterus.       Right eye: No discharge.        Left eye: No discharge.     Extraocular Movements: EOM normal.     Conjunctiva/sclera: Conjunctivae normal.     Pupils: Pupils are equal, round, and reactive to light.  Cardiovascular:     Rate and Rhythm: Normal rate and regular rhythm.     Heart sounds: No murmur heard. No friction rub. No gallop.   Pulmonary:     Effort: Pulmonary effort is normal. No respiratory distress.     Breath sounds: Normal breath sounds. No stridor. No rales.  Abdominal:     General: There is no distension.     Palpations: Abdomen is soft.     Tenderness: There is no abdominal tenderness.  Musculoskeletal:        General: No tenderness or edema.     Cervical back: Normal range of motion and neck supple.  Skin:    General: Skin is warm and dry.     Findings: No erythema or rash.  Neurological:     Mental Status: She is alert and oriented to person, place, and time.     Comments: Mental Status:  Alert and oriented to person, place, and time.  Attention and concentration normal.  Speech clear.  Recent memory is intact  Cranial Nerves:  II Visual Fields: Intact to confrontation. Visual fields intact. III, IV, VI: Pupils equal and reactive to light and near. Full eye movement without nystagmus  V Facial Sensation: Normal. No weakness of masticatory muscles  VII: No facial weakness or asymmetry  VIII Auditory Acuity: Grossly normal  IX/X: The uvula is midline; the  palate elevates symmetrically  XI: Normal sternocleidomastoid and trapezius strength  XII: The tongue is midline. No atrophy or fasciculations.   Motor System: Muscle Strength: 5/5 and symmetric in the upper and lower extremities. No pronation or drift.  Muscle Tone: Tone  and muscle bulk are normal in the upper and lower extremities.   Coordination:  No tremor.  Sensation: Intact to light touch  Gait: Routine gait normal.   Psychiatric:        Mood and Affect: Mood and affect normal.     ED Results and Treatments Labs (all labs ordered are listed, but only abnormal results are displayed) Labs Reviewed - No data to display                                                                                                                       EKG  EKG Interpretation  Date/Time:    Ventricular Rate:    PR Interval:    QRS Duration:   QT Interval:    QTC Calculation:   R Axis:     Text Interpretation:        Radiology CT Head Wo Contrast  Result Date: 09/08/2020 CLINICAL DATA:  Headache EXAM: CT HEAD WITHOUT CONTRAST TECHNIQUE: Contiguous axial images were obtained from the base of the skull through the vertex without intravenous contrast. COMPARISON:  None. FINDINGS: Brain: No evidence of acute territorial infarction, hemorrhage, hydrocephalus,extra-axial collection or mass lesion/mass effect. Normal gray-white differentiation. Ventricles are normal in size and contour. Vascular: No hyperdense vessel or unexpected calcification. Skull: The skull is intact. No fracture or focal lesion identified. Sinuses/Orbits: The visualized paranasal sinuses and mastoid air cells are clear. The orbits and globes intact. Other: None IMPRESSION: No acute intracranial abnormality. Electronically Signed   By: Prudencio Pair M.D.   On: 09/08/2020 02:51    Pertinent labs & imaging results that were available during my care of the patient were reviewed by me and considered in my medical decision making (see chart for details).  Medications Ordered in ED Medications  prochlorperazine (COMPAZINE) tablet 10 mg (10 mg Oral Given 09/08/20 0109)                                                                                                                                     Procedures Procedures  (including critical care time)  Medical Decision Making / ED Course I have reviewed the  nursing notes for this encounter and the patient's prior records (if available in EHR or on provided paperwork).   TAKAKO MINCKLER was evaluated in Emergency Department on 09/08/2020 for the symptoms described in the history of present illness. She was evaluated in the context of the global COVID-19 pandemic, which necessitated consideration that the patient might be at risk for infection with the SARS-CoV-2 virus that causes COVID-19. Institutional protocols and algorithms that pertain to the evaluation of patients at risk for COVID-19 are in a state of rapid change based on information released by regulatory bodies including the CDC and federal and state organizations. These policies and algorithms were followed during the patient's care in the ED.  Non focal neuro exam. No recent head trauma. No fever. Doubt meningitis. Doubt intracranial bleed. Doubt IIH.   CT head negative.  Will treat with migraine cocktail and reevaluate. Pain and BP improved.       Final Clinical Impression(s) / ED Diagnoses Final diagnoses:  Other secondary hypertension  Bad headache    The patient appears reasonably screened and/or stabilized for discharge and I doubt any other medical condition or other [facility] requiring further screening, evaluation, or treatment in the ED at this time prior to discharge. Safe for discharge with strict return precautions.  Disposition: Discharge  Condition: Good  I have discussed the results, Dx and Tx plan with the patient/family who expressed understanding and agree(s) with the plan. Discharge instructions discussed at length. The patient/family was given strict return precautions who verbalized understanding of the instructions. No further questions at time of discharge.    ED Discharge Orders    None      Follow Up: Lajean Manes, MD [address] [place] [address] [phone]  Call  to schedule an appointment for close follow up     This chart was dictated using voice recognition software.  Despite best efforts to proofread,  errors can occur which can change the documentation meaning.   Fatima Blank, MD 09/08/20 [phone]

## 2020-09-12 DIAGNOSIS — R002 Palpitations: Secondary | ICD-10-CM | POA: Diagnosis not present

## 2020-09-13 DIAGNOSIS — I10 Essential (primary) hypertension: Secondary | ICD-10-CM | POA: Diagnosis not present

## 2020-09-13 DIAGNOSIS — E559 Vitamin D deficiency, unspecified: Secondary | ICD-10-CM | POA: Diagnosis not present

## 2020-09-13 DIAGNOSIS — Z79899 Other long term (current) drug therapy: Secondary | ICD-10-CM | POA: Diagnosis not present

## 2020-09-13 DIAGNOSIS — Z0000 Encounter for general adult medical examination without abnormal findings: Secondary | ICD-10-CM | POA: Diagnosis not present

## 2020-10-02 DIAGNOSIS — Z1231 Encounter for screening mammogram for malignant neoplasm of breast: Secondary | ICD-10-CM | POA: Diagnosis not present

## 2020-10-02 DIAGNOSIS — Z124 Encounter for screening for malignant neoplasm of cervix: Secondary | ICD-10-CM | POA: Diagnosis not present

## 2020-10-02 DIAGNOSIS — Z01419 Encounter for gynecological examination (general) (routine) without abnormal findings: Secondary | ICD-10-CM | POA: Diagnosis not present

## 2020-12-16 DIAGNOSIS — E7800 Pure hypercholesterolemia, unspecified: Secondary | ICD-10-CM | POA: Diagnosis not present

## 2020-12-16 DIAGNOSIS — Z79899 Other long term (current) drug therapy: Secondary | ICD-10-CM | POA: Diagnosis not present

## 2020-12-25 ENCOUNTER — Ambulatory Visit: Payer: BC Managed Care – PPO | Admitting: Pulmonary Disease

## 2020-12-25 ENCOUNTER — Other Ambulatory Visit: Payer: Self-pay

## 2020-12-25 ENCOUNTER — Encounter: Payer: Self-pay | Admitting: Pulmonary Disease

## 2020-12-25 VITALS — BP 110/86 | HR 79 | Temp 98.0°F | Ht 61.0 in | Wt 130.2 lb

## 2020-12-25 DIAGNOSIS — J449 Chronic obstructive pulmonary disease, unspecified: Secondary | ICD-10-CM | POA: Diagnosis not present

## 2020-12-25 NOTE — Progress Notes (Signed)
She is doing well               Teresa Woodward    [id number]    06/22/1956  Primary Care Physician:Stoneking, Christiane Ha, MD  Referring Physician: Lajean Manes, [address] [place],  [place] [address]  Chief complaint: Follow-up for emphysema, apical bulla  HPI: 65 year old with history of bullous emphysema.  Previously followed by Dr.Clance  Not on any inhalers Complains of mild cough, chest tightness for the past 6 months.  She wants her lungs checked out again History notable for allergies, postnasal drip occasional heartburn for which she is taking antiacid medication. 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  She has a down comforter which she uses very occasionally Smoking history: 5-pack-year smoker.  Quit in 1998 Travel history: Originally from [place].  No significant recent travel Relevant family history: Dad had lung cancer.  He was a smoker.  Interim history:  No issues with regard to breathing She has occasional cough, no dyspnea, wheezing She is not on any inhalers   Outpatient Encounter Medications as of 12/25/2020  Medication Sig   acetaminophen (TYLENOL) 650 MG CR tablet Take 650 mg by mouth every 8 (eight) hours as needed for pain.   amLODipine (NORVASC) 5 MG tablet Take 5 mg by mouth daily.   Cholecalciferol (VITAMIN D3 PO) Take 2,000 Units by mouth daily.   hydrochlorothiazide (MICROZIDE) 12.5 MG capsule Take 12.5 mg by mouth daily.   zolpidem (AMBIEN) 10 MG tablet Take 5 mg by mouth at bedtime as needed. For sleep    meloxicam (MOBIC) 15 MG tablet TAKE 1 TABLET BY MOUTH EVERY DAY (Patient not taking: Reported on 12/25/2020)   No facility-administered encounter medications on file as of 12/25/2020.   Physical Exam: Blood pressure 110/86, pulse 79, temperature 98 F (36.7 C), temperature source Temporal, height 5\' 1"  (1.549 m), weight 130 lb 3.2 oz (59.1 kg), SpO2 98  %. Gen:      No acute distress HEENT:  EOMI, sclera anicteric Neck:     No masses; no thyromegaly Lungs:    Clear to auscultation bilaterally; normal respiratory effort CV:         Regular rate and rhythm; no murmurs Abd:      + bowel sounds; soft, non-tender; no palpable masses, no distension Ext:    No edema; adequate peripheral perfusion Skin:      Warm and dry; no rash Neuro: alert and oriented x 3 Psych: normal mood and affect   Data Reviewed: Imaging CT chest 11/04/2012-stable biapical scarring with bullous emphysema. CT chest 06/07/2017-confluent airspace opacities with patchy groundglass in the upper lobe. CT chest 07/07/2017-improvement in airspace opacities with residual mild scarring, emphysema with bullous changes in the apex. Chest x-ray 12/07/2019-emphysema. I have reviewed the images personally.  PFTs: 03/04/2020 FVC 2.79 [113%), FEV1 1.88 [97%], F/F 67, TLC 3.88 [80%], DLCO 14.53 [75%] Mild obstruction with minimal diffusion defect  Labs: CBC 02/14/2020-WBC 7.8, eos 3.9%, absolute eosinophil count 304 IgE 02/14/2020-106 Alpha-1 antitrypsin 02/14/2020-123, PI MM  Assessment:  Mild COPD, emphysema She has mild obstruction with bullous emphysema on lung imaging Currently asymptomatic and does not need inhalers Suspect her cough may be secondary to upper airway cough syndrome, GERD   Ex-smoker Does not meet criteria for screening CTs  Plan/Recommendations: Return to pulmonary clinic as needed  Marshell Garfinkel MD [facility] 12/25/2020, 8:59  AM  CC: Lajean Manes, MD

## 2020-12-25 NOTE — Patient Instructions (Signed)
I am glad you are doing well with your breathing Continue to observe off inhalers Return to clinic as needed

## 2021-01-10 DIAGNOSIS — H0102A Squamous blepharitis right eye, upper and lower eyelids: Secondary | ICD-10-CM | POA: Diagnosis not present

## 2021-01-10 DIAGNOSIS — H40033 Anatomical narrow angle, bilateral: Secondary | ICD-10-CM | POA: Diagnosis not present

## 2021-01-10 DIAGNOSIS — H40023 Open angle with borderline findings, high risk, bilateral: Secondary | ICD-10-CM | POA: Diagnosis not present

## 2021-01-10 DIAGNOSIS — H2513 Age-related nuclear cataract, bilateral: Secondary | ICD-10-CM | POA: Diagnosis not present

## 2021-03-13 DIAGNOSIS — I10 Essential (primary) hypertension: Secondary | ICD-10-CM | POA: Diagnosis not present

## 2021-03-13 DIAGNOSIS — Z23 Encounter for immunization: Secondary | ICD-10-CM | POA: Diagnosis not present

## 2021-03-13 DIAGNOSIS — R202 Paresthesia of skin: Secondary | ICD-10-CM | POA: Diagnosis not present

## 2021-03-13 DIAGNOSIS — Z79899 Other long term (current) drug therapy: Secondary | ICD-10-CM | POA: Diagnosis not present

## 2021-03-13 DIAGNOSIS — E7800 Pure hypercholesterolemia, unspecified: Secondary | ICD-10-CM | POA: Diagnosis not present

## 2021-05-05 ENCOUNTER — Encounter (HOSPITAL_COMMUNITY): Payer: Self-pay

## 2021-05-05 ENCOUNTER — Emergency Department (HOSPITAL_COMMUNITY)
Admission: EM | Admit: 2021-05-05 | Discharge: 2021-05-06 | Disposition: A | Discharge status: Home / Self Care | Payer: BC Managed Care – PPO | Attending: Emergency Medicine | Admitting: Emergency Medicine

## 2021-05-05 DIAGNOSIS — R42 Dizziness and giddiness: Secondary | ICD-10-CM | POA: Diagnosis not present

## 2021-05-05 DIAGNOSIS — R112 Nausea with vomiting, unspecified: Secondary | ICD-10-CM | POA: Insufficient documentation

## 2021-05-05 DIAGNOSIS — Z20822 Contact with and (suspected) exposure to covid-19: Secondary | ICD-10-CM | POA: Insufficient documentation

## 2021-05-05 DIAGNOSIS — R197 Diarrhea, unspecified: Secondary | ICD-10-CM | POA: Diagnosis not present

## 2021-05-05 DIAGNOSIS — E876 Hypokalemia: Secondary | ICD-10-CM | POA: Diagnosis not present

## 2021-05-05 DIAGNOSIS — R1084 Generalized abdominal pain: Secondary | ICD-10-CM | POA: Diagnosis not present

## 2021-05-05 DIAGNOSIS — R0902 Hypoxemia: Secondary | ICD-10-CM | POA: Diagnosis not present

## 2021-05-05 DIAGNOSIS — Z79899 Other long term (current) drug therapy: Secondary | ICD-10-CM | POA: Insufficient documentation

## 2021-05-05 DIAGNOSIS — R109 Unspecified abdominal pain: Secondary | ICD-10-CM | POA: Insufficient documentation

## 2021-05-05 DIAGNOSIS — I10 Essential (primary) hypertension: Secondary | ICD-10-CM | POA: Insufficient documentation

## 2021-05-05 LAB — COMPREHENSIVE METABOLIC PANEL
ALT: 14 U/L (ref 0–44)
AST: 20 U/L (ref 15–41)
Albumin: 4.5 g/dL (ref 3.5–5.0)
Alkaline Phosphatase: 86 U/L (ref 38–126)
Anion gap: 9 (ref 5–15)
BUN: 13 mg/dL (ref 8–23)
CO2: 26 mmol/L (ref 22–32)
Calcium: 9.6 mg/dL (ref 8.9–10.3)
Chloride: 101 mmol/L (ref 98–111)
Creatinine, Ser: 0.72 mg/dL (ref 0.44–1.00)
GFR, Estimated: >60 mL/min (ref >60)
Glucose, Bld: 117 mg/dL — ABNORMAL HIGH (ref 70–99)
Potassium: 3.4 mmol/L — ABNORMAL LOW (ref 3.5–5.1)
Sodium: 136 mmol/L (ref 135–145)
Total Bilirubin: 0.6 mg/dL (ref 0.3–1.2)
Total Protein: 8.3 g/dL — ABNORMAL HIGH (ref 6.5–8.1)

## 2021-05-05 LAB — CBC WITH DIFFERENTIAL/PLATELET
Abs Immature Granulocytes: 0.04 10*3/uL (ref 0.00–0.07)
Basophils Absolute: 0 10*3/uL (ref 0.0–0.1)
Basophils Relative: 0 %
Eosinophils Absolute: 0.1 10*3/uL (ref 0.0–0.5)
Eosinophils Relative: 1 %
HCT: 40.1 % (ref 36.0–46.0)
Hemoglobin: 14 g/dL (ref 12.0–15.0)
Immature Granulocytes: 0 %
Lymphocytes Relative: 11 %
Lymphs Abs: 1.7 10*3/uL (ref 0.7–4.0)
MCH: 31.1 pg (ref 26.0–34.0)
MCHC: 34.9 g/dL (ref 30.0–36.0)
MCV: 89.1 fL (ref 80.0–100.0)
Monocytes Absolute: 0.7 10*3/uL (ref 0.1–1.0)
Monocytes Relative: 4 %
Neutro Abs: 12.5 10*3/uL — ABNORMAL HIGH (ref 1.7–7.7)
Neutrophils Relative %: 84 %
Platelets: 255 10*3/uL (ref 150–400)
RBC: 4.5 MIL/uL (ref 3.87–5.11)
RDW: 12.1 % (ref 11.5–15.5)
WBC: 14.9 10*3/uL — ABNORMAL HIGH (ref 4.0–10.5)
nRBC: 0 % (ref 0.0–0.2)

## 2021-05-05 LAB — LIPASE, BLOOD: Lipase: 22 U/L (ref 11–51)

## 2021-05-05 NOTE — ED Provider Notes (Signed)
Emergency Medicine Provider Triage Evaluation Note  Teresa Woodward , a 65 y.o. female  was evaluated in triage.  Pt complains of numerous episodes of nonbloody, nonbilious emesis and nonbloody diarrhea that started earlier today.  Emesis and diarrhea associated with diffuse abdominal pain.  No fever or chills.  No sick contacts or known COVID exposures.  Denies chest pain and shortness of breath.  Patient states she felt lightheaded earlier today. No syncope.  Review of Systems  Positive: Emesis, diarrhea Negative: fever  Physical Exam  BP (!) 126/99 (BP Location: Left Arm)   Pulse 84   Temp 98 F (36.7 C) (Oral)   Resp 18   SpO2 100%  Gen:   Awake, no distress   Resp:  Normal effort  MSK:   Moves extremities without difficulty  Other:  Diffuse tenderness  Medical Decision Making  Medically screening exam initiated at 3:19 PM.  Appropriate orders placed.  Selina Cooley was informed that the remainder of the evaluation will be completed by another provider, this initial triage assessment does not replace that evaluation, and the importance of remaining in the ED until their evaluation is complete.  Abdominal labs COVID test   Karie Kirks [birth year] 1947    Delora Fuel, MD 12/52/71 [phone]

## 2021-05-05 NOTE — ED Triage Notes (Signed)
Pt presents with c/o vomiting and diarrhea that started this morning. Pt did have an MD appointment today but was unable to control the diarrhea so she came here.

## 2021-05-06 LAB — URINALYSIS, ROUTINE W REFLEX MICROSCOPIC
Bilirubin Urine: NEGATIVE
Glucose, UA: NEGATIVE mg/dL
Ketones, ur: 5 mg/dL — AB
Nitrite: NEGATIVE
Protein, ur: NEGATIVE mg/dL
Specific Gravity, Urine: 1.01 (ref 1.005–1.030)
pH: 6 (ref 5.0–8.0)

## 2021-05-06 LAB — RESP PANEL BY RT-PCR (FLU A&B, COVID) ARPGX2
Influenza A by PCR: NEGATIVE
Influenza B by PCR: NEGATIVE
SARS Coronavirus 2 by RT PCR: NEGATIVE

## 2021-05-06 MED ORDER — ONDANSETRON HCL 4 MG PO TABS: 4.0000 mg | ORAL_TABLET | Freq: Four times a day (QID) | ORAL | 0 refills | Status: AC | PRN

## 2021-05-06 MED ORDER — ONDANSETRON HCL 4 MG/2ML IJ SOLN
4.0000 mg | Freq: Once | INTRAMUSCULAR | Status: AC
  Administered 2021-05-06: 4 mg via INTRAVENOUS
  Filled 2021-05-06: qty 2

## 2021-05-06 MED ORDER — LACTATED RINGERS IV BOLUS
1000.0000 mL | Freq: Once | INTRAVENOUS | Status: AC
  Administered 2021-05-06: 1000 mL via INTRAVENOUS

## 2021-05-06 MED ORDER — POTASSIUM CHLORIDE CRYS ER 20 MEQ PO TBCR
40.0000 meq | EXTENDED_RELEASE_TABLET | Freq: Once | ORAL | Status: AC
  Administered 2021-05-06: 40 meq via ORAL
  Filled 2021-05-06: qty 2

## 2021-05-06 NOTE — ED Provider Notes (Signed)
[facility] DEPT Provider Note   CSN: [id number] Arrival date & time: 05/05/21  1503     History Chief Complaint  Patient presents with   Vomiting   Diarrhea    Teresa Woodward is a 65 y.o. female.  The history is provided by the patient.  Diarrhea She has history of hypertension, emphysema and comes in because of vomiting and diarrhea which started this morning.  She has vomited at least 8 or 9 times, and had countless watery bowel movements with.  She has had some fecal incontinence.  She has had some abdominal cramping, but she denies fever, chills, sweats.  There has been no blood or mucus in stool or emesis.  She denies any sick contacts, but had been at the [employer] the day before.  Her husband was at the same festival and did not get sick.  She has not done anything to treat her symptoms at home.  She has not had any diarrhea for the last 12 hours, she is continuing to have nausea.   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     Family History  Problem Relation Age of Onset   Breast cancer Mother 3   Colon cancer Father 3       smoker   Breast cancer Sister        dx in her 71s; paternal half sister   Prostate cancer Brother        dx in his 30s; full sibling   Brain cancer Maternal Aunt        dx in her 65s   Breast cancer Sister        dx in her 5s; paternal half sister   Prostate cancer Brother        dx in his 15s; full sibling   Brain cancer Maternal Aunt        dx in her 6s   Prostate cancer Cousin        maternal cousin    Social History   Tobacco Use   Smoking status: Former    Packs/day: 0.25    Years: 5.00    Pack years:  1.25    Types: Cigarettes   Smokeless tobacco: Former    Quit date: 11/01/1995  Substance Use Topics   Alcohol use: Yes    Alcohol/week: 7.0 standard drinks    Types: 7 drink(s) per week    Home Medications Prior to Admission medications   Medication Sig Start Date End Date Taking? Authorizing Provider  acetaminophen (TYLENOL) 650 MG CR tablet Take 650 mg by mouth every 8 (eight) hours as needed for pain.    [provider]  amLODipine (NORVASC) 5 MG tablet Take 5 mg by mouth daily.    [provider]  Cholecalciferol (VITAMIN D3 PO) Take 2,000 Units by mouth daily.    [provider]  hydrochlorothiazide (MICROZIDE) 12.5 MG capsule Take 12.5 mg by mouth daily.    [provider]  meloxicam (MOBIC) 15 MG tablet TAKE 1 TABLET BY MOUTH EVERY DAY Patient not taking: Reported on 12/25/2020 03/20/20   Amalia Hailey,  Dorathy Daft, DPM  zolpidem (AMBIEN) 10 MG tablet Take 5 mg by mouth at bedtime as needed. For sleep     [provider]    Allergies    Sulfa antibiotics  Review of Systems   Review of Systems  Gastrointestinal:  Positive for diarrhea.  All other systems reviewed and are negative.  Physical Exam Updated Vital Signs BP (!) 138/91   Pulse 87   Temp 98.4 F (36.9 C) (Oral)   Resp 16   Ht 5\' 1"  (1.549 m)   Wt 59.1 kg   SpO2 100%   BMI 24.60 kg/m   Physical Exam Vitals and nursing note reviewed.  65 year old female, resting comfortably and in no acute distress. Vital signs are significant for mildly elevated blood pressure. Oxygen saturation is 100%, which is normal. Head is normocephalic and atraumatic. PERRLA, EOMI. Oropharynx is clear. Neck is nontender and supple without adenopathy or JVD. Back is nontender and there is no CVA tenderness. Lungs are clear without rales, wheezes, or rhonchi. Chest is nontender. Heart has regular rate and rhythm without murmur. Abdomen is soft, flat, nontender without masses or hepatosplenomegaly and  peristalsis is hypoactive. Extremities have no cyanosis or edema, full range of motion is present. Skin is warm and dry without rash. Neurologic: Mental status is normal, cranial nerves are intact, moves all extremities equally.  ED Results / Procedures / Treatments   Labs (all labs ordered are listed, but only abnormal results are displayed) Labs Reviewed  CBC WITH DIFFERENTIAL/PLATELET - Abnormal; Notable for the following components:      Result Value   WBC 14.9 (*)    Neutro Abs 12.5 (*)    All other components within normal limits  COMPREHENSIVE METABOLIC PANEL - Abnormal; Notable for the following components:   Potassium 3.4 (*)    Glucose, Bld 117 (*)    Total Protein 8.3 (*)    All other components within normal limits  RESP PANEL BY RT-PCR (FLU A&B, COVID) ARPGX2  LIPASE, BLOOD  URINALYSIS, ROUTINE W REFLEX MICROSCOPIC   Procedures Procedures   Medications Ordered in ED Medications  lactated ringers bolus 1,000 mL (0 mLs Intravenous Stopped 05/06/21 0529)  ondansetron (ZOFRAN) injection 4 mg (4 mg Intravenous Given 05/06/21 0349)  potassium chloride SA (KLOR-CON) CR tablet 40 mEq (40 mEq Oral Given 05/06/21 0403)    ED Course  I have reviewed the triage vital signs and the nursing notes.  Pertinent lab results that were available during my care of the patient were reviewed by me and considered in my medical decision making (see chart for details).   MDM Rules/Calculators/A&P                         Nausea, vomiting, diarrhea and pattern strongly suggestive of viral gastroenteritis, possible food poisoning.  No red flags to suggest more serious pathology.  Exam is benign.  Labs ordered at triage are significant for mild hypokalemia, probably secondary to GI loss.  Also, mild leukocytosis with slight left shift.  With benign exam, no need for imaging.  She will be given IV fluids and ondansetron for nausea.  Old records are reviewed, and she has no relevant past  visits.  She feels much better following above-noted treatment, but still has some abdominal soreness.  She is discharged with a prescription for ondansetron, told to use over-the-counter loperamide as needed.  Return precautions discussed.  Final Clinical Impression(s) / ED Diagnoses Final  diagnoses:  Nausea vomiting and diarrhea  Hypokalemia    Rx / DC Orders ED Discharge Orders          Ordered    ondansetron (ZOFRAN) 4 MG tablet  Every 6 hours PRN        05/06/21 3734             Delora Fuel, MD 28/76/81 [phone]

## 2021-05-06 NOTE — ED Notes (Signed)
Pt states that she's been vomiting and having diarrhea today, she's only been able to eat a banana earlier this am

## 2021-05-06 NOTE — ED Notes (Signed)
Pt was able to keep down 20 meq of potassium but gagged and spit out the other

## 2021-05-06 NOTE — Discharge Instructions (Signed)
If diarrhea comes back, take loperamide (Imodium A-D) as needed to control it.

## 2021-06-13 DIAGNOSIS — Z23 Encounter for immunization: Secondary | ICD-10-CM | POA: Diagnosis not present

## 2021-06-17 DIAGNOSIS — H938X1 Other specified disorders of right ear: Secondary | ICD-10-CM | POA: Diagnosis not present

## 2021-06-17 DIAGNOSIS — Z03818 Encounter for observation for suspected exposure to other biological agents ruled out: Secondary | ICD-10-CM | POA: Diagnosis not present

## 2021-06-17 DIAGNOSIS — J069 Acute upper respiratory infection, unspecified: Secondary | ICD-10-CM | POA: Diagnosis not present

## 2021-06-17 DIAGNOSIS — H6120 Impacted cerumen, unspecified ear: Secondary | ICD-10-CM | POA: Diagnosis not present

## 2021-06-17 DIAGNOSIS — R0981 Nasal congestion: Secondary | ICD-10-CM | POA: Diagnosis not present

## 2021-07-17 DIAGNOSIS — Z23 Encounter for immunization: Secondary | ICD-10-CM | POA: Diagnosis not present

## 2021-09-22 DIAGNOSIS — R0681 Apnea, not elsewhere classified: Secondary | ICD-10-CM | POA: Diagnosis not present

## 2021-09-23 DIAGNOSIS — R0681 Apnea, not elsewhere classified: Secondary | ICD-10-CM | POA: Diagnosis not present

## 2021-09-24 DIAGNOSIS — I10 Essential (primary) hypertension: Secondary | ICD-10-CM | POA: Diagnosis not present

## 2021-09-24 DIAGNOSIS — Z79899 Other long term (current) drug therapy: Secondary | ICD-10-CM | POA: Diagnosis not present

## 2021-09-24 DIAGNOSIS — K219 Gastro-esophageal reflux disease without esophagitis: Secondary | ICD-10-CM | POA: Diagnosis not present

## 2021-09-24 DIAGNOSIS — E7800 Pure hypercholesterolemia, unspecified: Secondary | ICD-10-CM | POA: Diagnosis not present

## 2021-09-24 DIAGNOSIS — Z0000 Encounter for general adult medical examination without abnormal findings: Secondary | ICD-10-CM | POA: Diagnosis not present

## 2021-09-24 DIAGNOSIS — E559 Vitamin D deficiency, unspecified: Secondary | ICD-10-CM | POA: Diagnosis not present

## 2021-10-08 DIAGNOSIS — Z1231 Encounter for screening mammogram for malignant neoplasm of breast: Secondary | ICD-10-CM | POA: Diagnosis not present

## 2021-10-08 DIAGNOSIS — Z01419 Encounter for gynecological examination (general) (routine) without abnormal findings: Secondary | ICD-10-CM | POA: Diagnosis not present

## 2021-10-08 DIAGNOSIS — Z6824 Body mass index (BMI) 24.0-24.9, adult: Secondary | ICD-10-CM | POA: Diagnosis not present

## 2022-01-14 DIAGNOSIS — H40033 Anatomical narrow angle, bilateral: Secondary | ICD-10-CM | POA: Diagnosis not present

## 2022-01-14 DIAGNOSIS — H2513 Age-related nuclear cataract, bilateral: Secondary | ICD-10-CM | POA: Diagnosis not present

## 2022-01-14 DIAGNOSIS — H0264 Xanthelasma of left upper eyelid: Secondary | ICD-10-CM | POA: Diagnosis not present

## 2022-01-14 DIAGNOSIS — H40023 Open angle with borderline findings, high risk, bilateral: Secondary | ICD-10-CM | POA: Diagnosis not present

## 2022-03-23 DIAGNOSIS — I10 Essential (primary) hypertension: Secondary | ICD-10-CM | POA: Diagnosis not present

## 2022-03-23 DIAGNOSIS — Z79899 Other long term (current) drug therapy: Secondary | ICD-10-CM | POA: Diagnosis not present

## 2022-03-23 DIAGNOSIS — L989 Disorder of the skin and subcutaneous tissue, unspecified: Secondary | ICD-10-CM | POA: Diagnosis not present

## 2022-06-17 DIAGNOSIS — L438 Other lichen planus: Secondary | ICD-10-CM | POA: Diagnosis not present

## 2022-09-21 DIAGNOSIS — E7800 Pure hypercholesterolemia, unspecified: Secondary | ICD-10-CM | POA: Diagnosis not present

## 2022-09-21 DIAGNOSIS — I10 Essential (primary) hypertension: Secondary | ICD-10-CM | POA: Diagnosis not present

## 2022-11-16 DIAGNOSIS — Z01419 Encounter for gynecological examination (general) (routine) without abnormal findings: Secondary | ICD-10-CM | POA: Diagnosis not present

## 2022-11-16 DIAGNOSIS — Z1231 Encounter for screening mammogram for malignant neoplasm of breast: Secondary | ICD-10-CM | POA: Diagnosis not present

## 2022-11-16 DIAGNOSIS — Z6825 Body mass index (BMI) 25.0-25.9, adult: Secondary | ICD-10-CM | POA: Diagnosis not present

## 2022-12-04 IMAGING — CT CT HEAD W/O CM
3 of 4 series · 14 of 47 positions shown, 16 images · non-contrast
Comparison: None.

CLINICAL DATA: Headache

EXAM:
CT HEAD WITHOUT CONTRAST
TECHNIQUE: Contiguous axial images were obtained from the base of the skull
through the vertex without intravenous contrast.

[Series 5: coronal soft tissue · coronal · 0.31mm/px · 3 of 63 slices shown]
[im 21/63  brain]
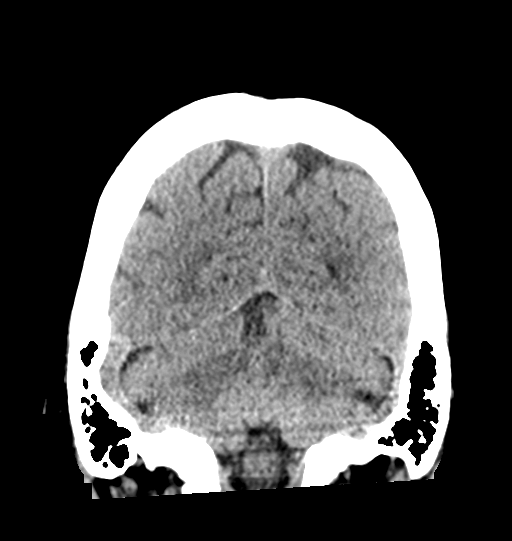
[im 28/63  brain]
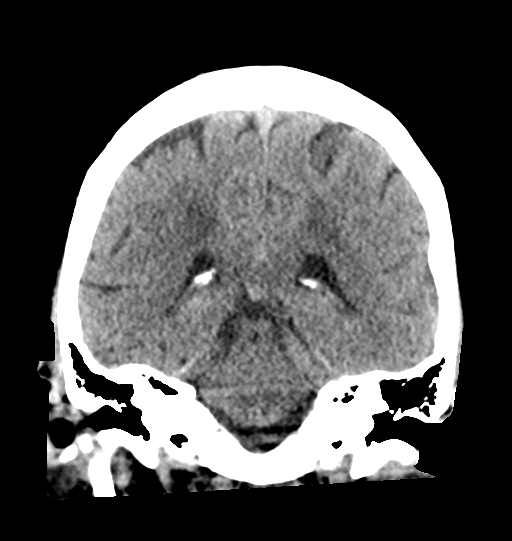
[im 35/63  brain]
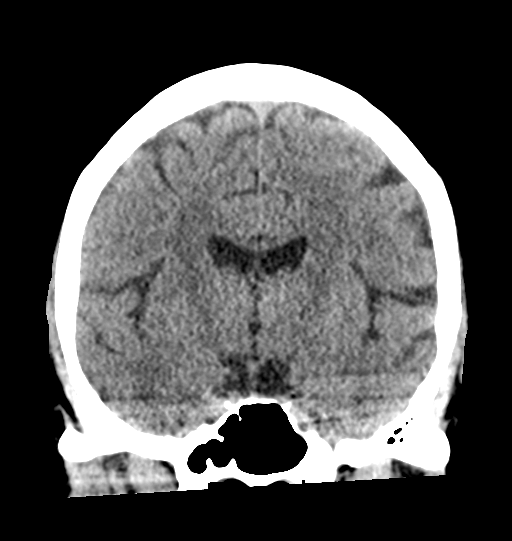

[Series 6: sagittal soft tissue · sagittal · 0.32mm/px · 3 of 54 slices shown]
[im 18/54  brain]
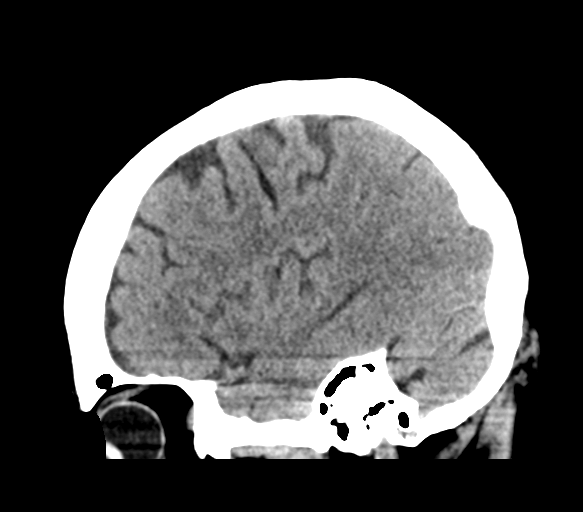
[im 27/54  brain]
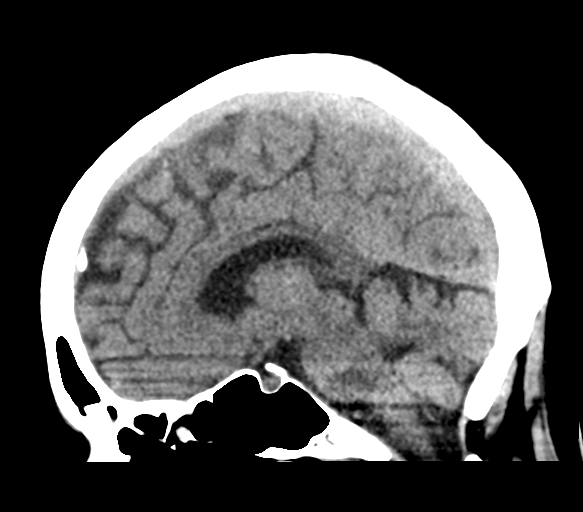
[im 36/54  brain]
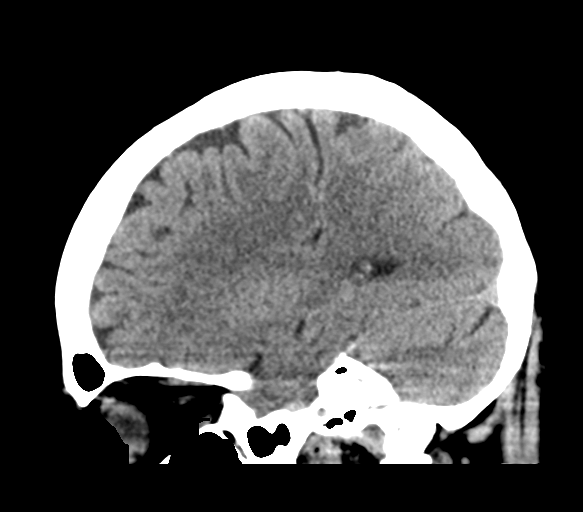

[Series 7: true axial · axial · 0.31mm/px · z∈[-152,-36]mm · 8 of 51 slices shown, 10 images]
[im 6/51  brain]
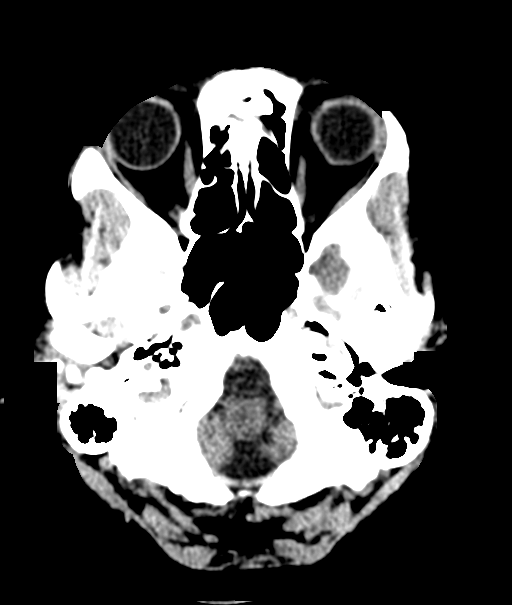
[im 6/51  bone]
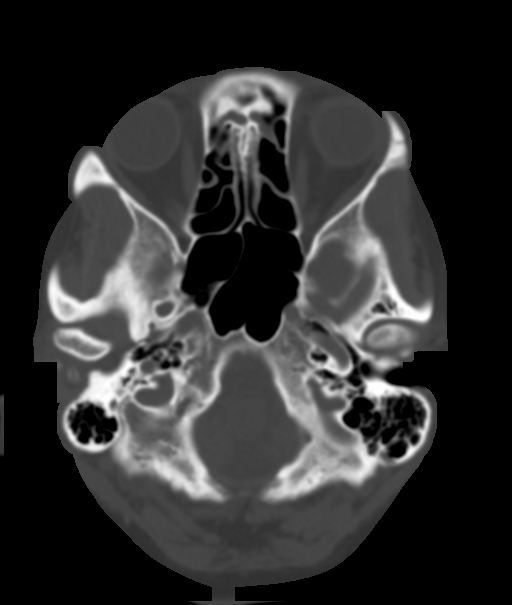
[im 12/51  brain]
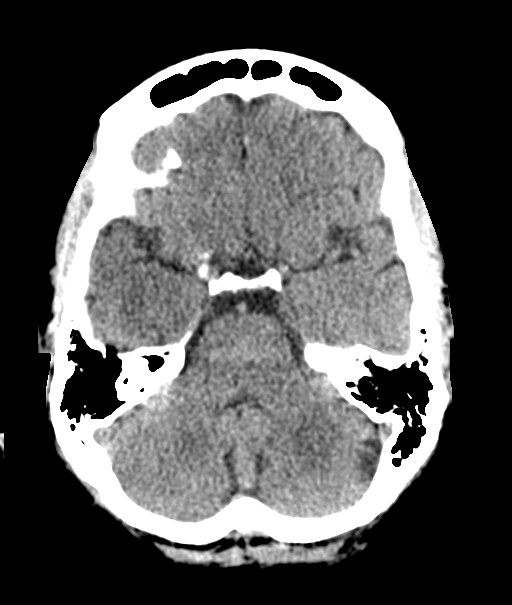
[im 17/51  brain]
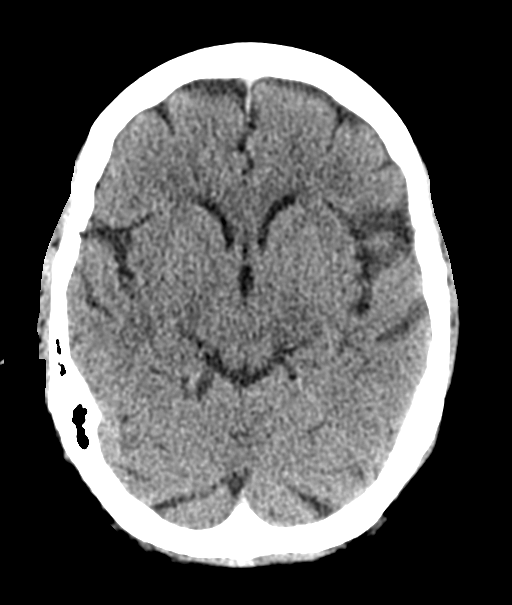
[im 23/51  brain]
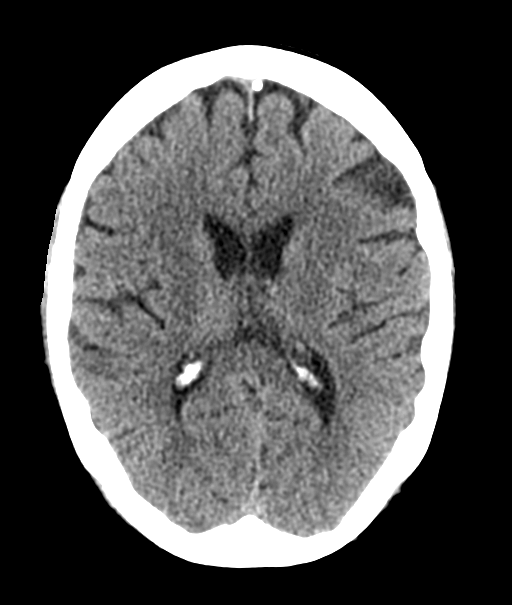
[im 28/51  brain]
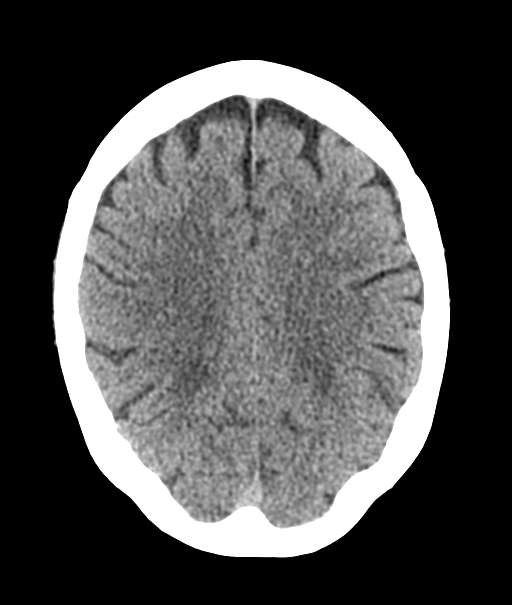
[im 28/51  bone]
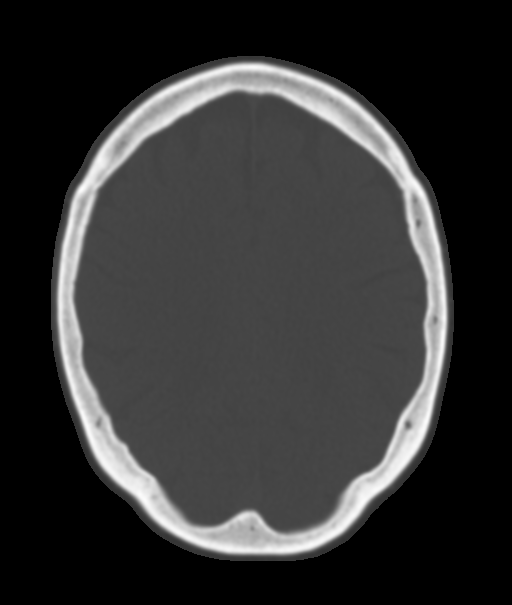
[im 34/51  brain]
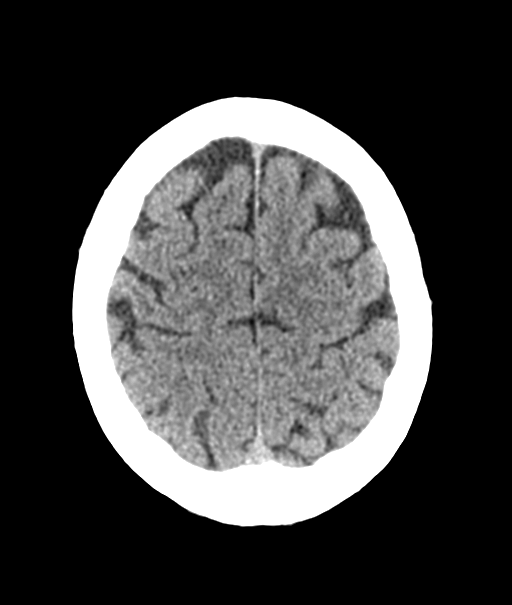
[im 39/51  brain]
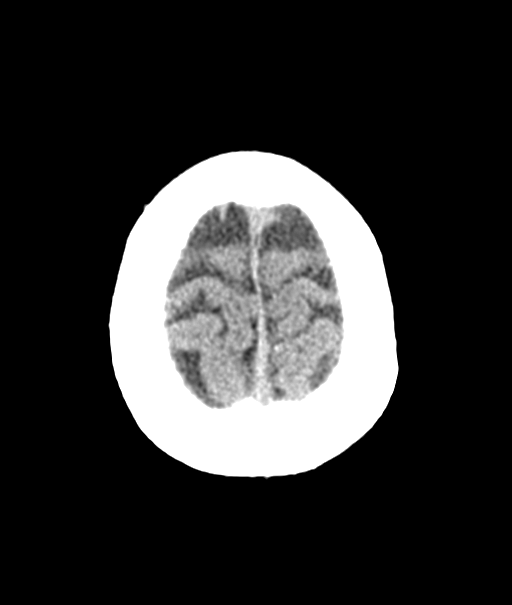
[im 45/51  brain]
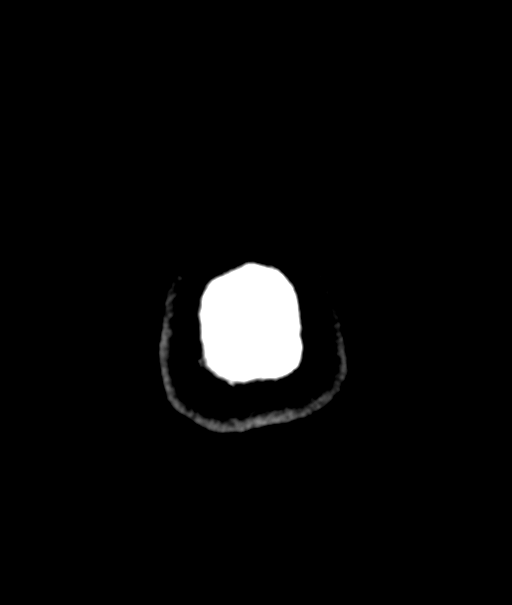

[14 of 47 positions shown; findings below may reference images not displayed]

FINDINGS: Brain: No evidence of acute territorial infarction, hemorrhage,
hydrocephalus,extra-axial collection or mass lesion/mass effect.
Normal gray-white differentiation. Ventricles are normal in size and
contour.

Vascular: No hyperdense vessel or unexpected calcification.

Skull: The skull is intact. No fracture or focal lesion identified.

Sinuses/Orbits: The visualized paranasal sinuses and mastoid air
cells are clear. The orbits and globes intact.

Other: None
IMPRESSION: No acute intracranial abnormality.

## 2022-12-22 DIAGNOSIS — Z0000 Encounter for general adult medical examination without abnormal findings: Secondary | ICD-10-CM | POA: Diagnosis not present

## 2022-12-22 DIAGNOSIS — G629 Polyneuropathy, unspecified: Secondary | ICD-10-CM | POA: Diagnosis not present

## 2022-12-22 DIAGNOSIS — E7800 Pure hypercholesterolemia, unspecified: Secondary | ICD-10-CM | POA: Diagnosis not present

## 2022-12-22 DIAGNOSIS — R946 Abnormal results of thyroid function studies: Secondary | ICD-10-CM | POA: Diagnosis not present

## 2022-12-22 DIAGNOSIS — Z79899 Other long term (current) drug therapy: Secondary | ICD-10-CM | POA: Diagnosis not present

## 2022-12-22 DIAGNOSIS — I10 Essential (primary) hypertension: Secondary | ICD-10-CM | POA: Diagnosis not present

## 2022-12-22 DIAGNOSIS — E559 Vitamin D deficiency, unspecified: Secondary | ICD-10-CM | POA: Diagnosis not present

## 2022-12-23 ENCOUNTER — Ambulatory Visit
Admission: RE | Admit: 2022-12-23 | Discharge: 2022-12-23 | Disposition: A | Discharge status: Home / Self Care | Payer: BC Managed Care – PPO | Source: Ambulatory Visit | Attending: Internal Medicine | Admitting: Internal Medicine

## 2022-12-23 ENCOUNTER — Other Ambulatory Visit: Payer: Self-pay | Admitting: Internal Medicine

## 2022-12-23 DIAGNOSIS — M1712 Unilateral primary osteoarthritis, left knee: Secondary | ICD-10-CM | POA: Diagnosis not present

## 2022-12-23 DIAGNOSIS — M25561 Pain in right knee: Secondary | ICD-10-CM

## 2022-12-23 DIAGNOSIS — G8929 Other chronic pain: Secondary | ICD-10-CM | POA: Diagnosis not present

## 2023-01-07 ENCOUNTER — Other Ambulatory Visit: Payer: Self-pay | Admitting: Internal Medicine

## 2023-01-07 DIAGNOSIS — M8580 Other specified disorders of bone density and structure, unspecified site: Secondary | ICD-10-CM

## 2023-01-07 DIAGNOSIS — Z1231 Encounter for screening mammogram for malignant neoplasm of breast: Secondary | ICD-10-CM

## 2023-01-29 DIAGNOSIS — H2513 Age-related nuclear cataract, bilateral: Secondary | ICD-10-CM | POA: Diagnosis not present

## 2023-01-29 DIAGNOSIS — H40023 Open angle with borderline findings, high risk, bilateral: Secondary | ICD-10-CM | POA: Diagnosis not present

## 2023-01-29 DIAGNOSIS — H11153 Pinguecula, bilateral: Secondary | ICD-10-CM | POA: Diagnosis not present

## 2023-01-29 DIAGNOSIS — H40033 Anatomical narrow angle, bilateral: Secondary | ICD-10-CM | POA: Diagnosis not present

## 2023-02-18 DIAGNOSIS — Z5181 Encounter for therapeutic drug level monitoring: Secondary | ICD-10-CM | POA: Diagnosis not present

## 2023-02-18 DIAGNOSIS — E7800 Pure hypercholesterolemia, unspecified: Secondary | ICD-10-CM | POA: Diagnosis not present

## 2023-04-01 DIAGNOSIS — M25561 Pain in right knee: Secondary | ICD-10-CM | POA: Diagnosis not present

## 2023-04-01 DIAGNOSIS — M6281 Muscle weakness (generalized): Secondary | ICD-10-CM | POA: Diagnosis not present

## 2023-04-09 DIAGNOSIS — M6281 Muscle weakness (generalized): Secondary | ICD-10-CM | POA: Diagnosis not present

## 2023-04-09 DIAGNOSIS — M25561 Pain in right knee: Secondary | ICD-10-CM | POA: Diagnosis not present

## 2023-04-15 DIAGNOSIS — M25561 Pain in right knee: Secondary | ICD-10-CM | POA: Diagnosis not present

## 2023-04-15 DIAGNOSIS — M6281 Muscle weakness (generalized): Secondary | ICD-10-CM | POA: Diagnosis not present

## 2023-04-22 DIAGNOSIS — M6281 Muscle weakness (generalized): Secondary | ICD-10-CM | POA: Diagnosis not present

## 2023-04-22 DIAGNOSIS — M25561 Pain in right knee: Secondary | ICD-10-CM | POA: Diagnosis not present

## 2023-04-27 DIAGNOSIS — M25561 Pain in right knee: Secondary | ICD-10-CM | POA: Diagnosis not present

## 2023-04-27 DIAGNOSIS — M6281 Muscle weakness (generalized): Secondary | ICD-10-CM | POA: Diagnosis not present

## 2023-04-29 DIAGNOSIS — M6281 Muscle weakness (generalized): Secondary | ICD-10-CM | POA: Diagnosis not present

## 2023-04-29 DIAGNOSIS — M25561 Pain in right knee: Secondary | ICD-10-CM | POA: Diagnosis not present

## 2023-04-30 ENCOUNTER — Other Ambulatory Visit (HOSPITAL_COMMUNITY): Payer: Self-pay | Admitting: Internal Medicine

## 2023-04-30 DIAGNOSIS — E7800 Pure hypercholesterolemia, unspecified: Secondary | ICD-10-CM

## 2023-05-04 DIAGNOSIS — M25561 Pain in right knee: Secondary | ICD-10-CM | POA: Diagnosis not present

## 2023-05-04 DIAGNOSIS — M6281 Muscle weakness (generalized): Secondary | ICD-10-CM | POA: Diagnosis not present

## 2023-05-06 ENCOUNTER — Ambulatory Visit (HOSPITAL_COMMUNITY)
Admission: RE | Admit: 2023-05-06 | Discharge: 2023-05-06 | Disposition: A | Discharge status: Home / Self Care | Payer: Self-pay | Source: Ambulatory Visit | Attending: Internal Medicine | Admitting: Internal Medicine

## 2023-05-06 DIAGNOSIS — E7800 Pure hypercholesterolemia, unspecified: Secondary | ICD-10-CM | POA: Insufficient documentation

## 2023-05-06 DIAGNOSIS — M6281 Muscle weakness (generalized): Secondary | ICD-10-CM | POA: Diagnosis not present

## 2023-05-06 DIAGNOSIS — M25561 Pain in right knee: Secondary | ICD-10-CM | POA: Diagnosis not present

## 2023-05-18 DIAGNOSIS — M6281 Muscle weakness (generalized): Secondary | ICD-10-CM | POA: Diagnosis not present

## 2023-05-18 DIAGNOSIS — M25561 Pain in right knee: Secondary | ICD-10-CM | POA: Diagnosis not present

## 2023-07-19 ENCOUNTER — Ambulatory Visit
Admission: RE | Admit: 2023-07-19 | Discharge: 2023-07-19 | Disposition: A | Discharge status: Home / Self Care | Payer: Medicare Other | Source: Ambulatory Visit | Attending: Internal Medicine | Admitting: Internal Medicine

## 2023-07-19 DIAGNOSIS — M8580 Other specified disorders of bone density and structure, unspecified site: Secondary | ICD-10-CM

## 2023-11-23 ENCOUNTER — Other Ambulatory Visit: Payer: Self-pay | Admitting: Obstetrics and Gynecology

## 2023-11-23 DIAGNOSIS — R928 Other abnormal and inconclusive findings on diagnostic imaging of breast: Secondary | ICD-10-CM

## 2023-12-03 ENCOUNTER — Ambulatory Visit
Admission: RE | Admit: 2023-12-03 | Discharge: 2023-12-03 | Disposition: A | Discharge status: Home / Self Care | Source: Ambulatory Visit | Attending: Obstetrics and Gynecology | Admitting: Obstetrics and Gynecology

## 2023-12-03 DIAGNOSIS — R928 Other abnormal and inconclusive findings on diagnostic imaging of breast: Secondary | ICD-10-CM
